# Patient Record
Sex: Female | Born: 1995 | Race: Black or African American | Hispanic: No | Marital: Single | Smoking: Never smoker
Health system: Southern US, Community
[De-identification: ages and names within clinical notes are randomized; demographics above are authoritative.]

## PROBLEM LIST (undated history)

## (undated) ENCOUNTER — Inpatient Hospital Stay (HOSPITAL_COMMUNITY): Payer: Self-pay

---
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
## 2014-02-22 ENCOUNTER — Encounter (HOSPITAL_COMMUNITY): Payer: Self-pay | Admitting: Emergency Medicine

## 2014-02-22 ENCOUNTER — Emergency Department (HOSPITAL_COMMUNITY)
Admission: EM | Admit: 2014-02-22 | Discharge: 2014-02-22 | Disposition: A | Discharge status: Home / Self Care | Payer: 59 | Attending: Emergency Medicine | Admitting: Emergency Medicine

## 2014-02-22 ENCOUNTER — Emergency Department (HOSPITAL_COMMUNITY): Payer: 59

## 2014-02-22 DIAGNOSIS — M542 Cervicalgia: Secondary | ICD-10-CM

## 2014-02-22 DIAGNOSIS — Y92410 Unspecified street and highway as the place of occurrence of the external cause: Secondary | ICD-10-CM | POA: Insufficient documentation

## 2014-02-22 DIAGNOSIS — Y9389 Activity, other specified: Secondary | ICD-10-CM | POA: Insufficient documentation

## 2014-02-22 DIAGNOSIS — R519 Headache, unspecified: Secondary | ICD-10-CM

## 2014-02-22 DIAGNOSIS — R51 Headache: Secondary | ICD-10-CM

## 2014-02-22 DIAGNOSIS — Y998 Other external cause status: Secondary | ICD-10-CM | POA: Insufficient documentation

## 2014-02-22 DIAGNOSIS — S0990XA Unspecified injury of head, initial encounter: Secondary | ICD-10-CM | POA: Insufficient documentation

## 2014-02-22 DIAGNOSIS — S199XXA Unspecified injury of neck, initial encounter: Secondary | ICD-10-CM | POA: Insufficient documentation

## 2014-02-22 MED ORDER — CYCLOBENZAPRINE HCL 10 MG PO TABS: 10.0000 mg | ORAL_TABLET | Freq: Two times a day (BID) | ORAL | Status: DC | PRN

## 2014-02-22 MED ORDER — IBUPROFEN 800 MG PO TABS: 800.0000 mg | ORAL_TABLET | Freq: Three times a day (TID) | ORAL | Status: DC

## 2014-02-22 MED ORDER — KETOROLAC TROMETHAMINE 30 MG/ML IJ SOLN
30.0000 mg | Freq: Once | INTRAMUSCULAR | Status: AC
  Administered 2014-02-22: 30 mg via INTRAMUSCULAR
  Filled 2014-02-22: qty 1
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
## 2014-02-22 NOTE — ED Notes (Signed)
Patient talking on cell phone when entering the room.
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
## 2014-02-24 ENCOUNTER — Emergency Department (HOSPITAL_COMMUNITY)
Admission: EM | Admit: 2014-02-24 | Discharge: 2014-02-24 | Disposition: A | Discharge status: Home / Self Care | Payer: 59 | Attending: Emergency Medicine | Admitting: Emergency Medicine

## 2014-02-24 ENCOUNTER — Encounter (HOSPITAL_COMMUNITY): Payer: Self-pay | Admitting: Emergency Medicine

## 2014-02-24 DIAGNOSIS — R42 Dizziness and giddiness: Secondary | ICD-10-CM | POA: Insufficient documentation

## 2014-02-24 DIAGNOSIS — H539 Unspecified visual disturbance: Secondary | ICD-10-CM | POA: Diagnosis not present

## 2014-02-24 DIAGNOSIS — Z791 Long term (current) use of non-steroidal anti-inflammatories (NSAID): Secondary | ICD-10-CM | POA: Diagnosis not present

## 2014-02-24 DIAGNOSIS — G8911 Acute pain due to trauma: Secondary | ICD-10-CM | POA: Insufficient documentation

## 2014-02-24 DIAGNOSIS — R0981 Nasal congestion: Secondary | ICD-10-CM | POA: Insufficient documentation

## 2014-02-24 DIAGNOSIS — R51 Headache: Secondary | ICD-10-CM | POA: Diagnosis present

## 2014-02-24 DIAGNOSIS — R519 Headache, unspecified: Secondary | ICD-10-CM

## 2014-02-24 MED ORDER — METOCLOPRAMIDE HCL 10 MG PO TABS: 10.0000 mg | ORAL_TABLET | Freq: Four times a day (QID) | ORAL | Status: DC | PRN

## 2014-02-24 MED ORDER — ACETAMINOPHEN 500 MG PO TABS
1000.0000 mg | ORAL_TABLET | Freq: Once | ORAL | Status: AC
  Administered 2014-02-24: 1000 mg via ORAL
  Filled 2014-02-24: qty 2

## 2014-02-24 NOTE — Discharge Instructions (Signed)
Concussion  A concussion, or closed-head injury, is a brain injury caused by a direct blow to the head or by a quick and sudden movement (jolt) of the head or neck. Concussions are usually not life-threatening. Even so, the effects of a concussion can be serious. If you have had a concussion before, you are more likely to experience concussion-like symptoms after a direct blow to the head.   CAUSES  · Direct blow to the head, such as from running into another player during a soccer game, being hit in a fight, or hitting your head on a hard surface.  · A jolt of the head or neck that causes the brain to move back and forth inside the skull, such as in a car crash.  SIGNS AND SYMPTOMS  The signs of a concussion can be hard to notice. Early on, they may be missed by you, family members, and health care providers. You may look fine but act or feel differently.  Symptoms are usually temporary, but they may last for days, weeks, or even longer. Some symptoms may appear right away while others may not show up for hours or days. Every head injury is different. Symptoms include:  · Mild to moderate headaches that will not go away.  · A feeling of pressure inside your head.  · Having more trouble than usual:  ¨ Learning or remembering things you have heard.  ¨ Answering questions.  ¨ Paying attention or concentrating.  ¨ Organizing daily tasks.  ¨ Making decisions and solving problems.  · Slowness in thinking, acting or reacting, speaking, or reading.  · Getting lost or being easily confused.  · Feeling tired all the time or lacking energy (fatigued).  · Feeling drowsy.  · Sleep disturbances.  ¨ Sleeping more than usual.  ¨ Sleeping less than usual.  ¨ Trouble falling asleep.  ¨ Trouble sleeping (insomnia).  · Loss of balance or feeling lightheaded or dizzy.  · Nausea or vomiting.  · Numbness or tingling.  · Increased sensitivity to:  ¨ Sounds.  ¨ Lights.  ¨ Distractions.  · Vision problems or eyes that tire  easily.  · Diminished sense of taste or smell.  · Ringing in the ears.  · Mood changes such as feeling sad or anxious.  · Becoming easily irritated or angry for little or no reason.  · Lack of motivation.  · Seeing or hearing things other people do not see or hear (hallucinations).  DIAGNOSIS  Your health care provider can usually diagnose a concussion based on a description of your injury and symptoms. He or she will ask whether you passed out (lost consciousness) and whether you are having trouble remembering events that happened right before and during your injury.  Your evaluation might include:  · A brain scan to look for signs of injury to the brain. Even if the test shows no injury, you may still have a concussion.  · Blood tests to be sure other problems are not present.  TREATMENT  · Concussions are usually treated in an emergency department, in urgent care, or at a clinic. You may need to stay in the hospital overnight for further treatment.  · Tell your health care provider if you are taking any medicines, including prescription medicines, over-the-counter medicines, and natural remedies. Some medicines, such as blood thinners (anticoagulants) and aspirin, may increase the chance of complications. Also tell your health care provider whether you have had alcohol or are taking illegal drugs. This information   may affect treatment.  · Your health care provider will send you home with important instructions to follow.  · How fast you will recover from a concussion depends on many factors. These factors include how severe your concussion is, what part of your brain was injured, your age, and how healthy you were before the concussion.  · Most people with mild injuries recover fully. Recovery can take time. In general, recovery is slower in older persons. Also, persons who have had a concussion in the past or have other medical problems may find that it takes longer to recover from their current injury.  HOME  CARE INSTRUCTIONS  General Instructions  · Carefully follow the directions your health care provider gave you.  · Only take over-the-counter or prescription medicines for pain, discomfort, or fever as directed by your health care provider.  · Take only those medicines that your health care provider has approved.  · Do not drink alcohol until your health care provider says you are well enough to do so. Alcohol and certain other drugs may slow your recovery and can put you at risk of further injury.  · If it is harder than usual to remember things, write them down.  · If you are easily distracted, try to do one thing at a time. For example, do not try to watch TV while fixing dinner.  · Talk with family members or close friends when making important decisions.  · Keep all follow-up appointments. Repeated evaluation of your symptoms is recommended for your recovery.  · Watch your symptoms and tell others to do the same. Complications sometimes occur after a concussion. Older adults with a brain injury may have a higher risk of serious complications, such as a blood clot on the brain.  · Tell your teachers, school nurse, school counselor, coach, athletic trainer, or work manager about your injury, symptoms, and restrictions. Tell them about what you can or cannot do. They should watch for:  ¨ Increased problems with attention or concentration.  ¨ Increased difficulty remembering or learning new information.  ¨ Increased time needed to complete tasks or assignments.  ¨ Increased irritability or decreased ability to cope with stress.  ¨ Increased symptoms.  · Rest. Rest helps the brain to heal. Make sure you:  ¨ Get plenty of sleep at night. Avoid staying up late at night.  ¨ Keep the same bedtime hours on weekends and weekdays.  ¨ Rest during the day. Take daytime naps or rest breaks when you feel tired.  · Limit activities that require a lot of thought or concentration. These include:  ¨ Doing homework or job-related  work.  ¨ Watching TV.  ¨ Working on the computer.  · Avoid any situation where there is potential for another head injury (football, hockey, soccer, basketball, martial arts, downhill snow sports and horseback riding). Your condition will get worse every time you experience a concussion. You should avoid these activities until you are evaluated by the appropriate follow-up health care providers.  Returning To Your Regular Activities  You will need to return to your normal activities slowly, not all at once. You must give your body and brain enough time for recovery.  · Do not return to sports or other athletic activities until your health care provider tells you it is safe to do so.  · Ask your health care provider when you can drive, ride a bicycle, or operate heavy machinery. Your ability to react may be slower after a   brain injury. Never do these activities if you are dizzy.  · Ask your health care provider about when you can return to work or school.  Preventing Another Concussion  It is very important to avoid another brain injury, especially before you have recovered. In rare cases, another injury can lead to permanent brain damage, brain swelling, or death. The risk of this is greatest during the first 7-10 days after a head injury. Avoid injuries by:  · Wearing a seat belt when riding in a car.  · Drinking alcohol only in moderation.  · Wearing a helmet when biking, skiing, skateboarding, skating, or doing similar activities.  · Avoiding activities that could lead to a second concussion, such as contact or recreational sports, until your health care provider says it is okay.  · Taking safety measures in your home.  ¨ Remove clutter and tripping hazards from floors and stairways.  ¨ Use grab bars in bathrooms and handrails by stairs.  ¨ Place non-slip mats on floors and in bathtubs.  ¨ Improve lighting in dim areas.  SEEK MEDICAL CARE IF:  · You have increased problems paying attention or  concentrating.  · You have increased difficulty remembering or learning new information.  · You need more time to complete tasks or assignments than before.  · You have increased irritability or decreased ability to cope with stress.  · You have more symptoms than before.  Seek medical care if you have any of the following symptoms for more than 2 weeks after your injury:  · Lasting (chronic) headaches.  · Dizziness or balance problems.  · Nausea.  · Vision problems.  · Increased sensitivity to noise or light.  · Depression or mood swings.  · Anxiety or irritability.  · Memory problems.  · Difficulty concentrating or paying attention.  · Sleep problems.  · Feeling tired all the time.  SEEK IMMEDIATE MEDICAL CARE IF:  · You have severe or worsening headaches. These may be a sign of a blood clot in the brain.  · You have weakness (even if only in one hand, leg, or part of the face).  · You have numbness.  · You have decreased coordination.  · You vomit repeatedly.  · You have increased sleepiness.  · One pupil is larger than the other.  · You have convulsions.  · You have slurred speech.  · You have increased confusion. This may be a sign of a blood clot in the brain.  · You have increased restlessness, agitation, or irritability.  · You are unable to recognize people or places.  · You have neck pain.  · It is difficult to wake you up.  · You have unusual behavior changes.  · You lose consciousness.  MAKE SURE YOU:  · Understand these instructions.  · Will watch your condition.  · Will get help right away if you are not doing well or get worse.  Document Released: 06/23/2003 Document Revised: 04/07/2013 Document Reviewed: 10/23/2012  ExitCare® Patient Information ©2015 [employer]. This information is not intended to replace advice given to you by your health care provider. Make sure you discuss any questions you have with your health care provider.
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
## 2015-06-03 ENCOUNTER — Encounter (HOSPITAL_COMMUNITY): Payer: Self-pay | Admitting: Emergency Medicine

## 2015-06-03 ENCOUNTER — Emergency Department (HOSPITAL_COMMUNITY)
Admission: EM | Admit: 2015-06-03 | Discharge: 2015-06-03 | Disposition: A | Discharge status: Home / Self Care | Payer: Self-pay | Source: Home / Self Care

## 2015-06-03 ENCOUNTER — Emergency Department (INDEPENDENT_AMBULATORY_CARE_PROVIDER_SITE_OTHER)
Admission: EM | Admit: 2015-06-03 | Discharge: 2015-06-03 | Disposition: A | Discharge status: Home / Self Care | Payer: Self-pay | Source: Home / Self Care | Attending: Family Medicine | Admitting: Family Medicine

## 2015-06-03 DIAGNOSIS — L254 Unspecified contact dermatitis due to food in contact with skin: Secondary | ICD-10-CM

## 2015-06-03 NOTE — Discharge Instructions (Signed)
Contact Dermatitis Continue using the hydrocortisone cream as needed 2-3 times a day. May also take antihistamine such as Benadryl or nonsedating antihistamine such as Claritin or Allegra. 4 development of swelling may use cold compresses. Dermatitis is redness, soreness, and swelling (inflammation) of the skin. Contact dermatitis is a reaction to certain substances that touch the skin. There are two types of contact dermatitis:   Irritant contact dermatitis. This type is caused by something that irritates your skin, such as dry hands from washing them too much. This type does not require previous exposure to the substance for a reaction to occur. This type is more common.  Allergic contact dermatitis. This type is caused by a substance that you are allergic to, such as a nickel allergy or poison ivy. This type only occurs if you have been exposed to the substance (allergen) before. Upon a repeat exposure, your body reacts to the substance. This type is less common. CAUSES  Many different substances can cause contact dermatitis. Irritant contact dermatitis is most commonly caused by exposure to:   Makeup.   Soaps.   Detergents.   Bleaches.   Acids.   Metal salts, such as nickel.  Allergic contact dermatitis is most commonly caused by exposure to:   Poisonous plants.   Chemicals.   Jewelry.   Latex.   Medicines.   Preservatives in products, such as clothing.  RISK FACTORS This condition is more likely to develop in:   People who have jobs that expose them to irritants or allergens.  People who have certain medical conditions, such as asthma or eczema.  SYMPTOMS  Symptoms of this condition may occur anywhere on your body where the irritant has touched you or is touched by you. Symptoms include:  Dryness or flaking.   Redness.   Cracks.   Itching.   Pain or a burning feeling.   Blisters.  Drainage of small amounts of blood or clear fluid from skin  cracks. With allergic contact dermatitis, there may also be swelling in areas such as the eyelids, mouth, or genitals.  DIAGNOSIS  This condition is diagnosed with a medical history and physical exam. A patch skin test may be performed to help determine the cause. If the condition is related to your job, you may need to see an occupational medicine specialist. TREATMENT Treatment for this condition includes figuring out what caused the reaction and protecting your skin from further contact. Treatment may also include:   Steroid creams or ointments. Oral steroid medicines may be needed in more severe cases.  Antibiotics or antibacterial ointments, if a skin infection is present.  Antihistamine lotion or an antihistamine taken by mouth to ease itching.  A bandage (dressing). HOME CARE INSTRUCTIONS Skin Care  Moisturize your skin as needed.   Apply cool compresses to the affected areas.  Try taking a bath with:  Epsom salts. Follow the instructions on the packaging. You can get these at your local pharmacy or grocery store.  Baking soda. Pour a small amount into the bath as directed by your health care provider.  Colloidal oatmeal. Follow the instructions on the packaging. You can get this at your local pharmacy or grocery store.  Try applying baking soda paste to your skin. Stir water into baking soda until it reaches a paste-like consistency.  Do not scratch your skin.  Bathe less frequently, such as every other day.  Bathe in lukewarm water. Avoid using hot water. Medicines  Take or apply over-the-counter and prescription medicines  only as told by your health care provider.   If you were prescribed an antibiotic medicine, take or apply your antibiotic as told by your health care provider. Do not stop using the antibiotic even if your condition starts to improve. General Instructions  Keep all follow-up visits as told by your health care provider. This is  important.  Avoid the substance that caused your reaction. If you do not know what caused it, keep a journal to try to track what caused it. Write down:  What you eat.  What cosmetic products you use.  What you drink.  What you wear in the affected area. This includes jewelry.  If you were given a dressing, take care of it as told by your health care provider. This includes when to change and remove it. SEEK MEDICAL CARE IF:   Your condition does not improve with treatment.  Your condition gets worse.  You have signs of infection such as swelling, tenderness, redness, soreness, or warmth in the affected area.  You have a fever.  You have new symptoms. SEEK IMMEDIATE MEDICAL CARE IF:   You have a severe headache, neck pain, or neck stiffness.  You vomit.  You feel very sleepy.  You notice red streaks coming from the affected area.  Your bone or joint underneath the affected area becomes painful after the skin has healed.  The affected area turns darker.  You have difficulty breathing.   This information is not intended to replace advice given to you by your health care provider. Make sure you discuss any questions you have with your health care provider.   Document Released: 03/30/2000 Document Revised: 12/22/2014 Document Reviewed: 08/18/2014 Elsevier Interactive Patient Education [employer]

## 2015-06-03 NOTE — ED Provider Notes (Signed)
CSN: [id number]     Arrival date & time 06/03/15  1610 History   First MD Initiated Contact with Patient 06/03/15 1717     Chief Complaint  Patient presents with  . Rash  . Facial Swelling   (Consider location/radiation/quality/duration/timing/severity/associated sxs/prior Treatment) HPI Comments: 20 year old female was at work this afternoon and she was handling fish and some of the "fish juice" splashed on her face. This occurred approximately 1:59 PM today. This caused some irritation of the skin with minor swelling and itching. She went to the bathroom and washed her face and then applied cortisone cream. But now there are no lesions, swelling or itching. She states she feels well and back to normal. She did not have a cough, intraoral swelling, trouble breathing or systemic symptoms. No other areas of itching or rash.  Patient is a 20 y.o. female presenting with rash.  Rash   History reviewed. No pertinent past medical history. History reviewed. No pertinent past surgical history. Family History  Problem Relation Age of Onset  . Hypertension Mother   . Hyperlipidemia Mother   . Hypertension Father   . Hyperlipidemia Other   . Heart failure Other   . Diabetes Other   . Hypertension Other    Social History  Substance Use Topics  . Smoking status: Never Smoker   . Smokeless tobacco: None  . Alcohol Use: No   OB History    No data available     Review of Systems  Constitutional: Negative.   HENT: Negative.   Respiratory: Negative.   Cardiovascular: Negative.   Genitourinary: Negative.   Musculoskeletal: Negative.   Skin: Positive for rash.  Neurological: Negative.     Allergies  Review of patient's allergies indicates no known allergies.  Home Medications   Prior to Admission medications   Medication Sig Start Date End Date Taking? Authorizing Provider  cyclobenzaprine (FLEXERIL) 10 MG tablet Take 1 tablet (10 mg total) by mouth 2 (two) times daily as needed  for muscle spasms. 02/22/14   Courtney Forcucci, PA-C  ibuprofen (ADVIL,MOTRIN) 800 MG tablet Take 1 tablet (800 mg total) by mouth 3 (three) times daily. 02/22/14   Courtney Forcucci, PA-C  metoCLOPramide (REGLAN) 10 MG tablet Take 1 tablet (10 mg total) by mouth every 6 (six) hours as needed for nausea (nausea/headache). 02/24/14   Ladona Mow, PA-C   Meds Ordered and Administered this Visit  Medications - No data to display  BP 129/85 mmHg  Pulse 90  Temp(Src) 98.5 F (36.9 C) (Oral)  Resp 14  SpO2 100% No data found.   Physical Exam  Constitutional: She is oriented to person, place, and time. She appears well-developed and well-nourished. No distress.  HENT:  Right Ear: External ear normal.  Left Ear: External ear normal.  Oropharynx with out swelling or erythema. There is mild-to-moderate clear PND. No intraoral edema, erythema or lesions. Swallowing reflex is normal.  Eyes: Conjunctivae and EOM are normal.  Neck: Normal range of motion. Neck supple.  Cardiovascular: Normal rate, regular rhythm and normal heart sounds.   Pulmonary/Chest: Effort normal and breath sounds normal.  Musculoskeletal: Normal range of motion. She exhibits no edema.  Lymphadenopathy:    She has no cervical adenopathy.  Neurological: She is alert and oriented to person, place, and time.  Skin: Skin is warm and dry. No rash noted. No erythema.  Psychiatric: She has a normal mood and affect.  Nursing note and vitals reviewed.   ED Course  Procedures (including critical  care time)  Labs Review Labs Reviewed - No data to display  Imaging Review No results found.   Visual Acuity Review  Right Eye Distance:   Left Eye Distance:   Bilateral Distance:    Right Eye Near:   Left Eye Near:    Bilateral Near:         MDM   1. Contact dermatitis due to fish    There are no symptoms or signs of an allergic reaction at this time. No lesions. No swelling. She states she feels well. Continue  using the hydrocortisone cream as needed 2-3 times a day. May also take antihistamine such as Benadryl or nonsedating antihistamine such as Claritin or Allegra. 4 development of swelling may use cold compresses.     Hayden Rasmussen, NP 06/03/15 [phone]

## 2015-06-03 NOTE — ED Notes (Signed)
The patient presented to the [facility] with a complaint of a possible contact reaction to fish that occurred today. The patient stated that some of the fish splattered in her face and her face started to itch and swell. The patient stated that she used cortizone cream on her face and it seemed to help the symptoms.

## 2015-08-24 ENCOUNTER — Other Ambulatory Visit: Payer: Self-pay | Admitting: Obstetrics & Gynecology

## 2015-08-24 DIAGNOSIS — E010 Iodine-deficiency related diffuse (endemic) goiter: Secondary | ICD-10-CM

## 2015-08-26 ENCOUNTER — Other Ambulatory Visit: Payer: BLUE CROSS/BLUE SHIELD

## 2016-01-12 ENCOUNTER — Ambulatory Visit (INDEPENDENT_AMBULATORY_CARE_PROVIDER_SITE_OTHER): Payer: BLUE CROSS/BLUE SHIELD | Admitting: *Deleted

## 2016-01-12 DIAGNOSIS — Z3202 Encounter for pregnancy test, result negative: Secondary | ICD-10-CM

## 2016-01-12 DIAGNOSIS — Z3200 Encounter for pregnancy test, result unknown: Secondary | ICD-10-CM

## 2016-01-12 LAB — POCT PREGNANCY, URINE: Preg Test, Ur: NEGATIVE

## 2016-01-12 NOTE — Progress Notes (Signed)
Patient presenting for pregnancy test which is negative. Patient informed of results, understanding voiced.

## 2016-03-04 ENCOUNTER — Emergency Department (HOSPITAL_COMMUNITY)
Admission: EM | Admit: 2016-03-04 | Discharge: 2016-03-04 | Disposition: A | Discharge status: Home / Self Care | Payer: BLUE CROSS/BLUE SHIELD | Attending: Emergency Medicine | Admitting: Emergency Medicine

## 2016-03-04 ENCOUNTER — Emergency Department (HOSPITAL_COMMUNITY): Payer: BLUE CROSS/BLUE SHIELD

## 2016-03-04 ENCOUNTER — Encounter (HOSPITAL_COMMUNITY): Payer: Self-pay | Admitting: Emergency Medicine

## 2016-03-04 DIAGNOSIS — R519 Headache, unspecified: Secondary | ICD-10-CM

## 2016-03-04 DIAGNOSIS — R51 Headache: Secondary | ICD-10-CM | POA: Insufficient documentation

## 2016-03-04 LAB — POC URINE PREG, ED: Preg Test, Ur: NEGATIVE

## 2016-03-04 MED ORDER — PROMETHAZINE HCL 25 MG/ML IJ SOLN
25.0000 mg | Freq: Once | INTRAMUSCULAR | Status: AC
  Administered 2016-03-04: 25 mg via INTRAMUSCULAR
  Filled 2016-03-04: qty 1

## 2016-03-04 MED ORDER — KETOROLAC TROMETHAMINE 60 MG/2ML IM SOLN
60.0000 mg | Freq: Once | INTRAMUSCULAR | Status: AC
  Administered 2016-03-04: 60 mg via INTRAMUSCULAR
  Filled 2016-03-04: qty 2

## 2016-03-04 NOTE — ED Triage Notes (Signed)
C/o headache x 1 week.  Denies nausea or visual problems.

## 2016-03-04 NOTE — Discharge Instructions (Signed)
Tylenol 1000 mg rotated with Motrin 600 mg every 4 hours as needed for pain.  Return to the emergency department if your symptoms significantly worsen or change.

## 2016-03-04 NOTE — ED Provider Notes (Signed)
[facility]-EMERGENCY DEPT Provider Note   CSN: [id number] Arrival date & time: 03/04/16  1741     History   Chief Complaint Chief Complaint  Patient presents with  . Headache    HPI Ashley Pruitt is a 20 y.o. female.  Patient is a 20 year old female who presents with complaints of headache. The headache is frontal in nature and has been persistent for the past week. She has had no relief with over-the-counter medications. She denies any injury or trauma. She denies any fevers, chills, or stiff neck. She denies a history of migraines and is never seen a doctor before for a headache. She denies any excessive alcohol or caffeine consumption.   The history is provided by the patient.  Headache   This is a new problem. Episode onset: 1 week ago. The problem occurs constantly. The problem has been gradually worsening. The headache is associated with bright light. The pain is located in the frontal region. The quality of the pain is described as dull. The pain is moderate. Pertinent negatives include no fever, no nausea and no vomiting. She has tried NSAIDs for the symptoms. The treatment provided no relief.    History reviewed. No pertinent past medical history.  There are no active problems to display for this patient.   History reviewed. No pertinent surgical history.  OB History    No data available       Home Medications    Prior to Admission medications   Medication Sig Start Date End Date Taking? Authorizing Provider  cyclobenzaprine (FLEXERIL) 10 MG tablet Take 1 tablet (10 mg total) by mouth 2 (two) times daily as needed for muscle spasms. 02/22/14   Courtney Forcucci, PA-C  ibuprofen (ADVIL,MOTRIN) 800 MG tablet Take 1 tablet (800 mg total) by mouth 3 (three) times daily. 02/22/14   Courtney Forcucci, PA-C  metoCLOPramide (REGLAN) 10 MG tablet Take 1 tablet (10 mg total) by mouth every 6 (six) hours as needed for nausea (nausea/headache). 02/24/14   Ladona MowJoe Mintz, PA-C     Family History Family History  Problem Relation Age of Onset  . Hypertension Mother   . Hyperlipidemia Mother   . Hypertension Father   . Hyperlipidemia Other   . Heart failure Other   . Diabetes Other   . Hypertension Other     Social History Social History  Substance Use Topics  . Smoking status: Never Smoker  . Smokeless tobacco: Never Used  . Alcohol use No     Allergies   Patient has no known allergies.   Review of Systems Review of Systems  Constitutional: Negative for fever.  Gastrointestinal: Negative for nausea and vomiting.  Neurological: Positive for headaches.  All other systems reviewed and are negative.    Physical Exam Updated Vital Signs BP 130/82 (BP Location: Left Arm)   Pulse 97   Temp 98.1 F (36.7 C) (Oral)   Resp 12   Ht 5\' 3"  (1.6 m)   Wt 197 lb (89.4 kg)   LMP 02/19/2016   SpO2 100%   BMI 34.90 kg/m   Physical Exam  Constitutional: She is oriented to person, place, and time. She appears well-developed and well-nourished. No distress.  HENT:  Head: Normocephalic and atraumatic.  Eyes: EOM are normal. Pupils are equal, round, and reactive to light.  There is no papilledema on funduscopic exam  Neck: Normal range of motion. Neck supple.  Cardiovascular: Normal rate and regular rhythm.  Exam reveals no gallop and no friction rub.  No murmur heard. Pulmonary/Chest: Effort normal and breath sounds normal. No respiratory distress. She has no wheezes.  Abdominal: Soft. Bowel sounds are normal. She exhibits no distension. There is no tenderness.  Musculoskeletal: Normal range of motion.  Neurological: She is alert and oriented to person, place, and time. No cranial nerve deficit. She exhibits normal muscle tone. Coordination normal.  Skin: Skin is warm and dry. She is not diaphoretic.  Nursing note and vitals reviewed.    ED Treatments / Results  Labs (all labs ordered are listed, but only abnormal results are displayed) Labs  Reviewed - No data to display  EKG  EKG Interpretation None       Radiology No results found.  Procedures Procedures (including critical care time)  Medications Ordered in ED Medications  ketorolac (TORADOL) injection 60 mg (not administered)  promethazine (PHENERGAN) injection 25 mg (not administered)     Initial Impression / Assessment and Plan / ED Course  I have reviewed the triage vital signs and the nursing notes.  Pertinent labs & imaging results that were available during my care of the patient were reviewed by me and considered in my medical decision making (see chart for details).  Clinical Course     Patient presents with persistent headache for the past week that began in the absence of any injury or trauma. Her neurologic exam is unremarkable and head CT is negative. I am uncertain as to the exact etiology of her headache, however she is feeling better after Toradol and Phenergan and nothing appears emergent. She will be discharged with alternating Tylenol and Motrin and follow-up/return as needed.  Final Clinical Impressions(s) / ED Diagnoses   Final diagnoses:  None    New Prescriptions New Prescriptions   No medications on file     Geoffery Lyonsouglas Nivea Wojdyla, MD 03/04/16 2013

## 2016-07-19 ENCOUNTER — Inpatient Hospital Stay (HOSPITAL_COMMUNITY): Payer: BLUE CROSS/BLUE SHIELD

## 2016-07-19 ENCOUNTER — Encounter (HOSPITAL_COMMUNITY): Payer: Self-pay | Admitting: *Deleted

## 2016-07-19 ENCOUNTER — Inpatient Hospital Stay (HOSPITAL_COMMUNITY)
Admission: AD | Admit: 2016-07-19 | Discharge: 2016-07-19 | Disposition: A | Discharge status: Home / Self Care | Payer: BLUE CROSS/BLUE SHIELD | Source: Ambulatory Visit | Attending: Obstetrics & Gynecology | Admitting: Obstetrics & Gynecology

## 2016-07-19 DIAGNOSIS — R109 Unspecified abdominal pain: Secondary | ICD-10-CM | POA: Insufficient documentation

## 2016-07-19 DIAGNOSIS — M545 Low back pain: Secondary | ICD-10-CM | POA: Insufficient documentation

## 2016-07-19 DIAGNOSIS — Z3491 Encounter for supervision of normal pregnancy, unspecified, first trimester: Secondary | ICD-10-CM

## 2016-07-19 DIAGNOSIS — O9989 Other specified diseases and conditions complicating pregnancy, childbirth and the puerperium: Secondary | ICD-10-CM

## 2016-07-19 DIAGNOSIS — Z3A01 Less than 8 weeks gestation of pregnancy: Secondary | ICD-10-CM | POA: Diagnosis not present

## 2016-07-19 DIAGNOSIS — O26891 Other specified pregnancy related conditions, first trimester: Secondary | ICD-10-CM

## 2016-07-19 LAB — CBC
HCT: 37.2 % (ref 36.0–46.0)
Hemoglobin: 12.4 g/dL (ref 12.0–15.0)
MCH: 26.2 pg (ref 26.0–34.0)
MCHC: 33.3 g/dL (ref 30.0–36.0)
MCV: 78.6 fL (ref 78.0–100.0)
PLATELETS: 305 10*3/uL (ref 150–400)
RBC: 4.73 MIL/uL (ref 3.87–5.11)
RDW: 15.9 % — ABNORMAL HIGH (ref 11.5–15.5)
WBC: 7.5 10*3/uL (ref 4.0–10.5)

## 2016-07-19 LAB — WET PREP, GENITAL
SPERM: NONE SEEN
TRICH WET PREP: NONE SEEN
Yeast Wet Prep HPF POC: NONE SEEN

## 2016-07-19 LAB — URINALYSIS, ROUTINE W REFLEX MICROSCOPIC
Bilirubin Urine: NEGATIVE
Glucose, UA: NEGATIVE mg/dL
HGB URINE DIPSTICK: NEGATIVE
KETONES UR: NEGATIVE mg/dL
Leukocytes, UA: NEGATIVE
NITRITE: NEGATIVE
Protein, ur: NEGATIVE mg/dL
Specific Gravity, Urine: 1.008 (ref 1.005–1.030)
pH: 6 (ref 5.0–8.0)

## 2016-07-19 LAB — ABO/RH: ABO/RH(D): A POS

## 2016-07-19 LAB — POCT PREGNANCY, URINE: PREG TEST UR: POSITIVE — AB

## 2016-07-19 LAB — HCG, QUANTITATIVE, PREGNANCY: hCG, Beta Chain, Quant, S: 69905 m[IU]/mL — ABNORMAL HIGH (ref <5)

## 2016-07-19 NOTE — Discharge Instructions (Signed)
[facility]  ° ° °[facility]       Phone: [phone] ° °[facility] Phone: [phone] ° °[facility]  Phone: [phone] ° °[facility] [place]  Phone: [phone] ° °[facility] [place]  Phone: [phone] ° °[facility]       Phone: [phone] ° °[facility]    Phone: [phone]  ° °[facility]    Phone: [phone] ° °[facility]    Phone: [phone] ° °[facility]    Phone: [phone] ° °[facility]    Phone: [phone] ° °[facility]       Phone: [phone]  ° °[facility]  Phone: [phone] ° °[facility]    Phone: [phone] ° °[facility]   Phone: [phone] ° °[facility]      Phone: [phone] ° °[facility]    Phone: [phone] ° °

## 2016-07-19 NOTE — MAU Note (Signed)
Pt C/O lower abd cramping & pressure, also back pain for the past week.  Denies bleeding.  Also vomiting for the past week, has constipation.

## 2016-07-19 NOTE — MAU Provider Note (Signed)
Chief Complaint: Abdominal Pain and Back Pain   None     SUBJECTIVE HPI: Ashley Pruitt is a 21 y.o. G1P0 at 8 weeks by LMP who presents to maternity admissions reporting Lower abdominal and Lower back pain. She has not taken a pregnancy test and was not aware of pregnancy until MAU visit. She states that the pain is dull, cramping in nature, non-radiating, and sporadic.  She has taken Tylenol at home without relief, and nothing that she notices makes the pain worse.  She endorses having unprotected sexual intercourse with a female partner, nausea for 3 days, and [facility] ending approximately on Feb. 11.  She denies vaginal bleeding, vaginal itching/burning, urinary symptoms, h/a, dizziness, vomiting, diarrhea, or fever/chills.     HPI  History reviewed. No pertinent past medical history. History reviewed. No pertinent surgical history. Social History   Social History  . Marital status: Single    Spouse name: N/A  . Number of children: N/A  . Years of education: N/A   Occupational History  . Not on file.   Social History Main Topics  . Smoking status: Never Smoker  . Smokeless tobacco: Never Used  . Alcohol use No     Comment: socially  . Drug use: No  . Sexual activity: Yes    Birth control/ protection: Condom   Other Topics Concern  . Not on file   Social History Narrative  . No narrative on file   No current facility-administered medications on file prior to encounter.    No current outpatient prescriptions on file prior to encounter.   No Known Allergies  ROS:  Review of Systems  Constitutional: Negative.   HENT: Negative.   Respiratory: Negative.   Cardiovascular: Negative.   Gastrointestinal: Positive for abdominal pain and nausea. Negative for abdominal distention, blood in stool, constipation, diarrhea and vomiting.  Genitourinary: Negative for difficulty urinating, dyspareunia, dysuria, flank pain, frequency, hematuria, vaginal bleeding and vaginal discharge.   Musculoskeletal: Positive for back pain. Negative for arthralgias, gait problem and myalgias.  Neurological: Negative for dizziness, weakness, numbness and headaches.     I have reviewed patient's Past Medical Hx, Surgical Hx, Family Hx, Social Hx, medications and allergies.   Physical Exam  Patient Vitals for the past 24 hrs:  BP Temp Temp src Pulse Resp  07/19/16 1919 113/66 99.1 F (37.3 C) Oral 86 17  07/19/16 1546 122/72 98.6 F (37 C) Oral 96 18     LAB RESULTS Results for orders placed or performed during the hospital encounter of 07/19/16 (from the past 24 hour(s))  Urinalysis, Routine w reflex microscopic     Status: Abnormal   Collection Time: 07/19/16  3:50 PM  Result Value Ref Range   Color, Urine YELLOW YELLOW   APPearance HAZY (A) CLEAR   Specific Gravity, Urine 1.008 1.005 - 1.030   pH 6.0 5.0 - 8.0   Glucose, UA NEGATIVE NEGATIVE mg/dL   Hgb urine dipstick NEGATIVE NEGATIVE   Bilirubin Urine NEGATIVE NEGATIVE   Ketones, ur NEGATIVE NEGATIVE mg/dL   Protein, ur NEGATIVE NEGATIVE mg/dL   Nitrite NEGATIVE NEGATIVE   Leukocytes, UA NEGATIVE NEGATIVE  Pregnancy, urine POC     Status: Abnormal   Collection Time: 07/19/16  4:13 PM  Result Value Ref Range   Preg Test, Ur POSITIVE (A) NEGATIVE  CBC     Status: Abnormal   Collection Time: 07/19/16  5:29 PM  Result Value Ref Range   WBC 7.5 4.0 - 10.5 K/uL  RBC 4.73 3.87 - 5.11 MIL/uL   Hemoglobin 12.4 12.0 - 15.0 g/dL   HCT 16.1 09.6 - 04.5 %   MCV 78.6 78.0 - 100.0 fL   MCH 26.2 26.0 - 34.0 pg   MCHC 33.3 30.0 - 36.0 g/dL   RDW 40.9 (H) 81.1 - 91.4 %   Platelets 305 150 - 400 K/uL  hCG, quantitative, pregnancy     Status: Abnormal   Collection Time: 07/19/16  5:29 PM  Result Value Ref Range   hCG, Beta Chain, Quant, S 69,905 (H) <5 mIU/mL  ABO/Rh     Status: None (Preliminary result)   Collection Time: 07/19/16  5:29 PM  Result Value Ref Range   ABO/RH(D) A POS     --/--/A POS (04/05  1729)  IMAGING US Ob Comp Less 14 Wks  Result Date: 07/19/2016 CLINICAL DATA:  Abdominal pain during first trimester pregnancy. Gestational age by LMP of 8 weeks 2 days. EXAM: OBSTETRIC <14 WK [place] AND TRANSVAGINAL OB US TECHNIQUE: Both transabdominal and transvaginal ultrasound examinations were performed for complete evaluation of the gestation as well as the maternal uterus, adnexal regions, and pelvic cul-de-sac. Transvaginal technique was performed to assess early pregnancy. COMPARISON:  None. FINDINGS: Intrauterine gestational sac: Single Yolk sac:  Visualized. Embryo:  Visualized. Cardiac Activity: Visualized. Heart Rate: 120  bpm CRL:  6  mm   6 w   3 d                  [place] EDC: 03/11/2017 Subchorionic hemorrhage:  None visualized. Maternal uterus/adnexae: Small left ovarian corpus luteum cyst noted. Normal appearance of right ovary. No mass or free fluid identified. IMPRESSION: Single living IUP measuring 6 weeks 3 days, with [place] EDC of 03/11/2017. No significant maternal uterine or adnexal abnormality identified. Electronically Signed   By: Myles Rosenthal M.D.   On: 07/19/2016 19:25   US Ob Transvaginal  Result Date: 07/19/2016 CLINICAL DATA:  Abdominal pain during first trimester pregnancy. Gestational age by LMP of 8 weeks 2 days. EXAM: OBSTETRIC <14 WK [place] AND TRANSVAGINAL OB US TECHNIQUE: Both transabdominal and transvaginal ultrasound examinations were performed for complete evaluation of the gestation as well as the maternal uterus, adnexal regions, and pelvic cul-de-sac. Transvaginal technique was performed to assess early pregnancy. COMPARISON:  None. FINDINGS: Intrauterine gestational sac: Single Yolk sac:  Visualized. Embryo:  Visualized. Cardiac Activity: Visualized. Heart Rate: 120  bpm CRL:  6  mm   6 w   3 d                  [place] EDC: 03/11/2017 Subchorionic hemorrhage:  None visualized. Maternal uterus/adnexae: Small left ovarian corpus luteum cyst noted. Normal appearance of right ovary. No  mass or free fluid identified. IMPRESSION: Single living IUP measuring 6 weeks 3 days, with [place] EDC of 03/11/2017. No significant maternal uterine or adnexal abnormality identified. Electronically Signed   By: Myles Rosenthal M.D.   On: 07/19/2016 19:25    MAU Management/MDM: Ordered labs and [place] and reviewed results.  IUP at [redacted]w[redacted]d noted on [place] today.  CL cyst on left may be causing pain.  Reviewed normal findings with pt, denies need for treatment.  F/U with prenatal care as desired, list of providers given.  Pt stable at time of discharge.  ASSESSMENT 1. Normal IUP (intrauterine pregnancy) on prenatal ultrasound, first trimester   2. Abdominal pain during pregnancy, first trimester     PLAN Discharge home  Allergies as  of 07/19/2016   No Known Allergies     Medication List    TAKE these medications   acetaminophen 325 MG tablet Commonly known as:  TYLENOL Take 650 mg by mouth every 6 (six) hours as needed for mild pain or headache.   prenatal multivitamin Tabs tablet Take 1 tablet by mouth daily at 12 noon.      Follow-up Information    With prenatal provider of your choice Follow up.   Why:  Return to MAU as needed for OB/Gyn emergencies          Sharen Counter Certified Nurse-Midwife 07/19/2016  7:51 PM

## 2016-07-19 NOTE — MAU Note (Signed)
Pt presents with c/o lower abdominal "period" cramps & pain in lower back.  Denies spotting/vaginal bleeding.

## 2016-07-19 NOTE — MAU Note (Signed)
Discharge instructions reviewed with patient, patient verbalized understanding, e-signature obtained, hard copy given.

## 2016-07-20 LAB — HIV ANTIBODY (ROUTINE TESTING W REFLEX): HIV Screen 4th Generation wRfx: NONREACTIVE

## 2016-07-20 LAB — GC/CHLAMYDIA PROBE AMP (~~LOC~~) NOT AT ARMC
Chlamydia: NEGATIVE
Neisseria Gonorrhea: NEGATIVE

## 2016-08-01 ENCOUNTER — Inpatient Hospital Stay (HOSPITAL_COMMUNITY)
Admission: AD | Admit: 2016-08-01 | Discharge: 2016-08-01 | Disposition: A | Discharge status: Home / Self Care | Payer: BLUE CROSS/BLUE SHIELD | Source: Ambulatory Visit | Attending: Obstetrics and Gynecology | Admitting: Obstetrics and Gynecology

## 2016-08-01 DIAGNOSIS — J069 Acute upper respiratory infection, unspecified: Secondary | ICD-10-CM | POA: Insufficient documentation

## 2016-08-01 DIAGNOSIS — R0981 Nasal congestion: Secondary | ICD-10-CM | POA: Diagnosis present

## 2016-08-01 DIAGNOSIS — O99511 Diseases of the respiratory system complicating pregnancy, first trimester: Secondary | ICD-10-CM | POA: Insufficient documentation

## 2016-08-01 DIAGNOSIS — Z3A08 8 weeks gestation of pregnancy: Secondary | ICD-10-CM | POA: Diagnosis not present

## 2016-08-01 DIAGNOSIS — R6889 Other general symptoms and signs: Secondary | ICD-10-CM | POA: Diagnosis not present

## 2016-08-01 LAB — INFLUENZA PANEL BY PCR (TYPE A & B)
Influenza A By PCR: NEGATIVE
Influenza B By PCR: NEGATIVE

## 2016-08-01 MED ORDER — BENZONATATE 100 MG PO CAPS: 100.0000 mg | ORAL_CAPSULE | Freq: Three times a day (TID) | ORAL | 0 refills | Status: DC

## 2016-08-01 MED ORDER — GUAIFENESIN-CODEINE 100-10 MG/5ML PO SOLN: 5.0000 mL | Freq: Every evening | ORAL | 0 refills | Status: DC | PRN

## 2016-08-01 NOTE — MAU Provider Note (Signed)
History     CSN: [id number]  Arrival date and time: 08/01/16 1306   None     Chief Complaint  Patient presents with  . Nasal Congestion  . Cough   HPI   Ms.Ornella Fluhr is a 21 y.o. female G1P0 @ [redacted]w[redacted]d here in MAU with complaints of cough, sore throat, and congestion X 1 week. No one else in her house is sick. She has tried sudafed, and allergy medicine. With only some relief.   No fever at home.  Patient states cough is keeping her up at night.   OB History    Gravida Para Term Preterm AB Living   1             SAB TAB Ectopic Multiple Live Births                  No past medical history on file.  No past surgical history on file.  Family History  Problem Relation Age of Onset  . Hypertension Mother   . Hyperlipidemia Mother   . Hypertension Father   . Heart failure Father   . Diabetes Maternal Grandmother   . Hyperlipidemia Maternal Grandmother   . Hypertension Maternal Grandmother   . Hypertension Maternal Grandfather   . Diabetes Paternal Grandmother   . Hypertension Paternal Grandmother   . Hypertension Paternal Grandfather   . Heart failure Paternal Grandfather     Social History  Substance Use Topics  . Smoking status: Never Smoker  . Smokeless tobacco: Never Used  . Alcohol use No     Comment: socially    Allergies: No Known Allergies  Prescriptions Prior to Admission  Medication Sig Dispense Refill Last Dose  . acetaminophen (TYLENOL) 325 MG tablet Take 650 mg by mouth every 6 (six) hours as needed for mild pain or headache.   Past Week at Unknown time  . Prenatal Vit-Fe Fumarate-FA (PRENATAL MULTIVITAMIN) TABS tablet Take 1 tablet by mouth daily at 12 noon.   07/19/2016 at Unknown time   Results for orders placed or performed during the hospital encounter of 08/01/16 (from the past 48 hour(s))  Influenza panel by PCR (type A & B)     Status: None   Collection Time: 08/01/16  2:13 PM  Result Value Ref Range   Influenza A By PCR NEGATIVE  NEGATIVE   Influenza B By PCR NEGATIVE NEGATIVE    Comment: (NOTE) The Xpert Xpress Flu assay is intended as an aid in the diagnosis of  influenza and should not be used as a sole basis for treatment.  This  assay is FDA approved for nasopharyngeal swab specimens only. Nasal  washings and aspirates are unacceptable for Xpert Xpress Flu testing.    Review of Systems  Constitutional: Negative for fever.  HENT: Positive for congestion and sore throat.   Respiratory: Positive for cough and shortness of breath.    Physical Exam   Blood pressure 121/70, pulse 98, temperature 97.4 F (36.3 C), temperature source Oral, resp. rate 16, last menstrual period 05/22/2016, SpO2 98 %.  Physical Exam  Constitutional: She is oriented to person, place, and time. She appears well-developed and well-nourished.  Non-toxic appearance. She has a sickly appearance. She does not appear ill. No distress.  HENT:  Head: Normocephalic.  Cardiovascular: Normal rate.   Respiratory: Effort normal and breath sounds normal.  Musculoskeletal: Normal range of motion.  Neurological: She is alert and oriented to person, place, and time.  Skin: Skin is warm. She  is not diaphoretic.  Psychiatric: Her behavior is normal.   MAU Course  Procedures  None  MDM  Due to longevity of symptoms; influenza swab collected, low suspicion with absent fever, no tachycardia.  Assessment and Plan   A:  1. Flu-like symptoms   2. Upper respiratory virus     P:  Discharge home in stable condition Patient notified of influenza results Rx: Codeine cough, Tessalon pearles Follow up with OB/PCP If symptoms worsen go to Urgent care Cool mist humidifier    Duane Lope, NP 08/01/2016 3:52 PM

## 2016-08-01 NOTE — Discharge Instructions (Signed)
Cool Mist Vaporizer A cool mist vaporizer is a device that releases a cool mist into the air. If you have a cough or a cold, using a vaporizer may help relieve your symptoms. The mist adds moisture to the air, which may help thin your mucus and make it less sticky. When your mucus is thin and less sticky, it easier for you to breathe and to cough up secretions. Do not use a vaporizer if you are allergic to mold. Follow these instructions at home:  Follow the instructions that come with the vaporizer.  Do not use anything other than distilled water in the vaporizer.  Do not run the vaporizer all of the time. Doing that can cause mold or bacteria to grow in the vaporizer.  Clean the vaporizer after each time that you use it.  Clean and dry the vaporizer well before storing it.  Stop using the vaporizer if your breathing symptoms get worse. This information is not intended to replace advice given to you by your health care provider. Make sure you discuss any questions you have with your health care provider. Document Released: 12/29/2003 Document Revised: 10/21/2015 Document Reviewed: 07/02/2015 Elsevier Interactive Patient Education  2017 [employer]   Cough, Adult A cough helps to clear your throat and lungs. A cough may last only 2-3 weeks (acute), or it may last longer than 8 weeks (chronic). Many different things can cause a cough. A cough may be a sign of an illness or another medical condition. Follow these instructions at home:  Pay attention to any changes in your cough.  Take medicines only as told by your doctor.  If you were prescribed an antibiotic medicine, take it as told by your doctor. Do not stop taking it even if you start to feel better.  Talk with your doctor before you try using a cough medicine.  Drink enough fluid to keep your pee (urine) clear or pale yellow.  If the air is dry, use a cold steam vaporizer or humidifier in your home.  Stay away from things  that make you cough at work or at home.  If your cough is worse at night, try using extra pillows to raise your head up higher while you sleep.  Do not smoke, and try not to be around smoke. If you need help quitting, ask your doctor.  Do not have caffeine.  Do not drink alcohol.  Rest as needed. Contact a doctor if:  You have new problems (symptoms).  You cough up yellow fluid (pus).  Your cough does not get better after 2-3 weeks, or your cough gets worse.  Medicine does not help your cough and you are not sleeping well.  You have pain that gets worse or pain that is not helped with medicine.  You have a fever.  You are losing weight and you do not know why.  You have night sweats. Get help right away if:  You cough up blood.  You have trouble breathing.  Your heartbeat is very fast. This information is not intended to replace advice given to you by your health care provider. Make sure you discuss any questions you have with your health care provider. Document Released: 12/14/2010 Document Revised: 09/08/2015 Document Reviewed: 06/09/2014 Elsevier Interactive Patient Education  2017 [employer].

## 2016-08-01 NOTE — MAU Note (Addendum)
Pt thinks she may have sinus infection, has a non-productive cough.  Allergy medication not helping.  Also has sore throat, is unsure if she has had a fever, is very congested.  Denies abd pain or bleeding.  Has occasional vomiting, no diarrhea.

## 2016-08-03 ENCOUNTER — Encounter (HOSPITAL_COMMUNITY): Payer: Self-pay

## 2016-08-03 DIAGNOSIS — O26891 Other specified pregnancy related conditions, first trimester: Secondary | ICD-10-CM | POA: Insufficient documentation

## 2016-08-03 DIAGNOSIS — R1013 Epigastric pain: Secondary | ICD-10-CM | POA: Insufficient documentation

## 2016-08-03 DIAGNOSIS — Z3A10 10 weeks gestation of pregnancy: Secondary | ICD-10-CM | POA: Diagnosis not present

## 2016-08-03 DIAGNOSIS — O219 Vomiting of pregnancy, unspecified: Secondary | ICD-10-CM | POA: Diagnosis not present

## 2016-08-03 LAB — LIPASE, BLOOD: LIPASE: 19 U/L (ref 11–51)

## 2016-08-03 LAB — COMPREHENSIVE METABOLIC PANEL
ALT: 10 U/L — ABNORMAL LOW (ref 14–54)
AST: 17 U/L (ref 15–41)
Albumin: 3.7 g/dL (ref 3.5–5.0)
Alkaline Phosphatase: 54 U/L (ref 38–126)
Anion gap: 8 (ref 5–15)
BILIRUBIN TOTAL: 0.4 mg/dL (ref 0.3–1.2)
CHLORIDE: 104 mmol/L (ref 101–111)
CO2: 21 mmol/L — ABNORMAL LOW (ref 22–32)
Calcium: 9.2 mg/dL (ref 8.9–10.3)
Creatinine, Ser: 0.5 mg/dL (ref 0.44–1.00)
GFR calc non Af Amer: >60 mL/min (ref >60)
Glucose, Bld: 96 mg/dL (ref 65–99)
POTASSIUM: 3.6 mmol/L (ref 3.5–5.1)
Sodium: 133 mmol/L — ABNORMAL LOW (ref 135–145)
TOTAL PROTEIN: 7.1 g/dL (ref 6.5–8.1)

## 2016-08-03 LAB — CBC
HEMATOCRIT: 36.7 % (ref 36.0–46.0)
Hemoglobin: 12.3 g/dL (ref 12.0–15.0)
MCH: 25.9 pg — ABNORMAL LOW (ref 26.0–34.0)
MCHC: 33.5 g/dL (ref 30.0–36.0)
MCV: 77.3 fL — ABNORMAL LOW (ref 78.0–100.0)
PLATELETS: 320 10*3/uL (ref 150–400)
RBC: 4.75 MIL/uL (ref 3.87–5.11)
RDW: 15.5 % (ref 11.5–15.5)
WBC: 9.6 10*3/uL (ref 4.0–10.5)

## 2016-08-03 LAB — URINALYSIS, ROUTINE W REFLEX MICROSCOPIC
Bilirubin Urine: NEGATIVE
Glucose, UA: NEGATIVE mg/dL
Hgb urine dipstick: NEGATIVE
KETONES UR: NEGATIVE mg/dL
LEUKOCYTES UA: NEGATIVE
NITRITE: NEGATIVE
PH: 5 (ref 5.0–8.0)
PROTEIN: NEGATIVE mg/dL
Specific Gravity, Urine: 1.012 (ref 1.005–1.030)

## 2016-08-03 NOTE — ED Triage Notes (Signed)
Pt states that she is [redacted] weeks pregnant and ate a sandwich, vomited about 3 times immediately after, nausea continues, as well as upper abd pain. Denies fevers

## 2016-08-04 ENCOUNTER — Emergency Department (HOSPITAL_COMMUNITY)
Admission: EM | Admit: 2016-08-04 | Discharge: 2016-08-04 | Disposition: A | Discharge status: Home / Self Care | Payer: BLUE CROSS/BLUE SHIELD | Attending: Emergency Medicine | Admitting: Emergency Medicine

## 2016-08-04 DIAGNOSIS — O219 Vomiting of pregnancy, unspecified: Secondary | ICD-10-CM

## 2016-08-04 DIAGNOSIS — R1013 Epigastric pain: Secondary | ICD-10-CM

## 2016-08-04 MED ORDER — METOCLOPRAMIDE HCL 5 MG/ML IJ SOLN
10.0000 mg | Freq: Once | INTRAMUSCULAR | Status: AC
  Administered 2016-08-04: 10 mg via INTRAVENOUS
  Filled 2016-08-04: qty 2

## 2016-08-04 MED ORDER — DIPHENHYDRAMINE HCL 50 MG/ML IJ SOLN
25.0000 mg | Freq: Once | INTRAMUSCULAR | Status: AC
  Administered 2016-08-04: 25 mg via INTRAVENOUS
  Filled 2016-08-04: qty 1

## 2016-08-04 MED ORDER — ALUM & MAG HYDROXIDE-SIMETH 200-200-20 MG/5ML PO SUSP
15.0000 mL | Freq: Once | ORAL | Status: AC
  Administered 2016-08-04: 15 mL via ORAL
  Filled 2016-08-04: qty 30

## 2016-08-04 MED ORDER — SODIUM CHLORIDE 0.9 % IV BOLUS (SEPSIS)
1000.0000 mL | Freq: Once | INTRAVENOUS | Status: AC
  Administered 2016-08-04: 1000 mL via INTRAVENOUS

## 2016-08-04 NOTE — ED Provider Notes (Signed)
[facility]-EMERGENCY DEPT Provider Note   CSN: [id number] Arrival date & time: 08/03/16  2143   By signing my name below, I, Clovis Pu, attest that this documentation has been prepared under the direction and in the presence of Gilda Crease, MD  Electronically Signed: Clovis Pu, ED Scribe. 08/04/16. 1:31 AM.   History   Chief Complaint Chief Complaint  Patient presents with  . Abdominal Pain  . Emesis During Pregnancy    HPI Comments:  Ashley Pruitt is a 21 y.o. female who presents to the Emergency Department complaining of acute onset, intermittent, moderate, upper abdominal pain beginning yesterday. She also reports nausea and 4 episodes of vomiting. Pt states she began vomiting while eating a sandwich. Her pain is worse with palpation. No alleviating factors noted. Pt denies any other associated symptoms. She notes she is [redacted] weeks pregnant. No other complaints noted at this time.   The history is provided by the patient. No language interpreter was used.    History reviewed. No pertinent past medical history.  There are no active problems to display for this patient.   History reviewed. No pertinent surgical history.  OB History    Gravida Para Term Preterm AB Living   1             SAB TAB Ectopic Multiple Live Births                   Home Medications    Prior to Admission medications   Medication Sig Start Date End Date Taking? Authorizing Provider  acetaminophen (TYLENOL) 325 MG tablet Take 650 mg by mouth every 6 (six) hours as needed for mild pain or headache.    Historical Provider, MD  benzonatate (TESSALON) 100 MG capsule Take 1 capsule (100 mg total) by mouth every 8 (eight) hours. 08/01/16   Duane Lope, NP  guaiFENesin-codeine 100-10 MG/5ML syrup Take 5 mLs by mouth at bedtime as needed for cough. 08/01/16   Duane Lope, NP  Prenatal Vit-Fe Fumarate-FA (PRENATAL MULTIVITAMIN) TABS tablet Take 1 tablet by mouth daily at 12 noon.     Historical Provider, MD    Family History Family History  Problem Relation Age of Onset  . Hypertension Mother   . Hyperlipidemia Mother   . Hypertension Father   . Heart failure Father   . Diabetes Maternal Grandmother   . Hyperlipidemia Maternal Grandmother   . Hypertension Maternal Grandmother   . Hypertension Maternal Grandfather   . Diabetes Paternal Grandmother   . Hypertension Paternal Grandmother   . Hypertension Paternal Grandfather   . Heart failure Paternal Grandfather     Social History Social History  Substance Use Topics  . Smoking status: Never Smoker  . Smokeless tobacco: Never Used  . Alcohol use No     Comment: socially     Allergies   Patient has no known allergies.   Review of Systems Review of Systems  Constitutional: Negative for fever.  Gastrointestinal: Positive for abdominal pain, nausea and vomiting.  All other systems reviewed and are negative.  Physical Exam Updated Vital Signs BP 124/80   Pulse 86   Temp 99 F (37.2 C) (Oral)   Resp 18   Ht  (1.575 m)   Wt 198 lb (89.8 kg)   LMP 05/22/2016   SpO2 99%   BMI 36.21 kg/m   Physical Exam  Constitutional: She is oriented to person, place, and time. She appears well-developed and well-nourished. No distress.  HENT:  Head: Normocephalic and atraumatic.  Right Ear: Hearing normal.  Left Ear: Hearing normal.  Nose: Nose normal.  Mouth/Throat: Oropharynx is clear and moist and mucous membranes are normal.  Eyes: Conjunctivae and EOM are normal. Pupils are equal, round, and reactive to light.  Neck: Normal range of motion. Neck supple.  Cardiovascular: Regular rhythm, S1 normal and S2 normal.  Exam reveals no gallop and no friction rub.   No murmur heard. Pulmonary/Chest: Effort normal and breath sounds normal. No respiratory distress. She exhibits no tenderness.  Abdominal: Soft. Normal appearance and bowel sounds are normal. There is no hepatosplenomegaly. There is tenderness  in the epigastric area. There is no rebound, no guarding, no tenderness at McBurney's point and negative Murphy's sign. No hernia.  Musculoskeletal: Normal range of motion.  Neurological: She is alert and oriented to person, place, and time. She has normal strength. No cranial nerve deficit or sensory deficit. Coordination normal. GCS eye subscore is 4. GCS verbal subscore is 5. GCS motor subscore is 6.  Skin: Skin is warm, dry and intact. No rash noted. No cyanosis.  Psychiatric: She has a normal mood and affect. Her speech is normal and behavior is normal. Thought content normal.  Nursing note and vitals reviewed.    ED Treatments / Results  DIAGNOSTIC STUDIES:  Oxygen Saturation is 99% on RA, normal by my interpretation.    COORDINATION OF CARE:  1:30 AM Discussed treatment plan with pt at bedside and pt agreed to plan.  Labs (all labs ordered are listed, but only abnormal results are displayed) Labs Reviewed  COMPREHENSIVE METABOLIC PANEL - Abnormal; Notable for the following:       Result Value   Sodium 133 (*)    CO2 21 (*)    BUN <5 (*)    ALT 10 (*)    All other components within normal limits  CBC - Abnormal; Notable for the following:    MCV 77.3 (*)    MCH 25.9 (*)    All other components within normal limits  URINALYSIS, ROUTINE W REFLEX MICROSCOPIC - Abnormal; Notable for the following:    APPearance HAZY (*)    All other components within normal limits  LIPASE, BLOOD    EKG  EKG Interpretation None       Radiology No results found.  Procedures Procedures (including critical care time)  Medications Ordered in ED Medications  sodium chloride 0.9 % bolus 1,000 mL (1,000 mLs Intravenous New Bag/Given 08/04/16 0213)  metoCLOPramide (REGLAN) injection 10 mg (10 mg Intravenous Given 08/04/16 0211)  diphenhydrAMINE (BENADRYL) injection 25 mg (25 mg Intravenous Given 08/04/16 0210)  alum & mag hydroxide-simeth (MAALOX/MYLANTA) 200-200-20 MG/5ML suspension 15  mL (15 mLs Oral Given 08/04/16 0212)     Initial Impression / Assessment and Plan / ED Course  I have reviewed the triage vital signs and the nursing notes.  Pertinent labs & imaging results that were available during my care of the patient were reviewed by me and considered in my medical decision making (see chart for details).     Patient presents with complaints of abdominal pain with nausea and vomiting. Patient is approximately [redacted] weeks pregnant. She has not had any problems with her pregnancy thus far. Patient reports that she ate half of a [employer] sandwich and then started having discomfort in her upper abdomen followed by nausea and vomiting. Patient has not had any pain or discomfort below the umbilicus. She denies bleeding, discharge, fluid rash. Symptoms not  consistent with obstetric complication. She was administered antacid, IV fluids, Reglan. Symptoms have resolved. Examination revealed tenderness in the epigastric region initially, minimal right upper quadrant tenderness. No Murphy sign. Normal liver function test. Doubt gallbladder disease. Repeat examination after treatment reveals no tenderness. Patient will therefore be discharged, follow-up with her OB/GYN.  Final Clinical Impressions(s) / ED Diagnoses   Final diagnoses:  Nausea and vomiting in pregnancy  Epigastric pain    New Prescriptions New Prescriptions   No medications on file  I personally performed the services described in this documentation, which was scribed in my presence. The recorded information has been reviewed and is accurate.     Gilda Crease, MD 08/04/16 [id number]

## 2017-01-29 ENCOUNTER — Inpatient Hospital Stay (HOSPITAL_COMMUNITY)
Admission: AD | Admit: 2017-01-29 | Discharge: 2017-01-29 | Disposition: A | Discharge status: Home / Self Care | Payer: BLUE CROSS/BLUE SHIELD | Source: Ambulatory Visit | Attending: Obstetrics and Gynecology | Admitting: Obstetrics and Gynecology

## 2017-01-29 DIAGNOSIS — Z3A34 34 weeks gestation of pregnancy: Secondary | ICD-10-CM | POA: Diagnosis not present

## 2017-01-29 DIAGNOSIS — O26893 Other specified pregnancy related conditions, third trimester: Secondary | ICD-10-CM | POA: Diagnosis not present

## 2017-01-29 DIAGNOSIS — R109 Unspecified abdominal pain: Secondary | ICD-10-CM | POA: Diagnosis present

## 2017-01-29 DIAGNOSIS — O26899 Other specified pregnancy related conditions, unspecified trimester: Secondary | ICD-10-CM

## 2017-01-29 LAB — WET PREP, GENITAL
CLUE CELLS WET PREP: NONE SEEN
SPERM: NONE SEEN
TRICH WET PREP: NONE SEEN

## 2017-01-29 LAB — GROUP B STREP BY PCR: GROUP B STREP BY PCR: NEGATIVE

## 2017-01-29 NOTE — MAU Provider Note (Signed)
DATE: 01/29/2017  Maternity Admissions Unit History and Physical Exam for an Obstetrics Patient  [name] is a 20 y.o. female, G1P0, at [redacted]w[redacted]d gestation, who presents for evaluation of abdominal pain. She was at work today and had a sharp pain in her lower back and her lower abdomen. That pain has since resolved. She had one episode of leakage of fluid that may have been urine. She denies bleeding and leakage of fluid now. She has been followed at the [facility] and Gynecology division of [facility]. See history below.  OB History    Gravida Para Term Preterm AB Living   1             SAB TAB Ectopic Multiple Live Births                  No past medical history on file.  Prescriptions Prior to Admission  Medication Sig Dispense Refill Last Dose  . acetaminophen (TYLENOL) 325 MG tablet Take 650 mg by mouth every 6 (six) hours as needed for mild pain or headache.   01/28/2017 at Unknown time  . Prenatal Vit-Fe Fumarate-FA (PRENATAL MULTIVITAMIN) TABS tablet Take 1 tablet by mouth daily after breakfast.    01/29/2017 at Unknown time    No past surgical history on file.  No Known Allergies  Family History: family history includes Diabetes in her maternal grandmother and paternal grandmother; Heart failure in her father and paternal grandfather; Hyperlipidemia in her maternal grandmother and mother; Hypertension in her father, maternal grandfather, maternal grandmother, mother, paternal grandfather, and paternal grandmother.  Social History:  reports that she has never smoked. She has never used smokeless tobacco. She reports that she does not drink alcohol or use drugs.  Review of systems: Normal pregnancy complaints.  Admission Physical Exam:  Dilation: Closed Effacement (%): Thick Exam by:: Dr Stefano Gaul Body mass index is 38.23 kg/m.  Blood pressure 108/68, pulse 98, temperature 98.6 F (37 C), temperature source Oral, resp. rate 18,  height  (1.575 m), weight 94.8 kg (209 lb), last menstrual period 05/22/2016, SpO2 100 %.  HEENT:                 Within normal limits Chest:                   Clear Heart:                    Regular rate and rhythm Abdomen:             Gravid and nontender Extremities:          Grossly normal Neurologic exam: Grossly normal Pelvic exam:         Cervix: closed, long, -4  No results found for this or any previous visit (from the past 24 hour(s)).   Prenatal labs: ABO, Rh:             --/--/A POS (04/05 1729)  NST: Category 1; Contractions: none .   Assessment:  [redacted]w[redacted]d gestation  Abdominal pain with no evidence of pathology. No pain now.  Plan:  I reviewed abdominal pain in pregnancy. The patient will call for questions or concerns. She will follow up in the office in one week.  Gonorrhea, Chlamydia, beta strep, and wet prep sent to the lab. We will follow up on an outpatient basis.   Janine Limbo 01/29/2017, 2:14 PM

## 2017-01-29 NOTE — Discharge Instructions (Signed)

## 2017-01-29 NOTE — MAU Note (Signed)
Having pain in lower back and sharp pains in lower stomach.  Was having Braxton Hicks yesterday.  When got to work today, the pains started and are getting worse.  Is having to pee a lot, panties were wet when got home, ? leaking

## 2017-01-30 LAB — GC/CHLAMYDIA PROBE AMP (~~LOC~~) NOT AT ARMC
CHLAMYDIA, DNA PROBE: NEGATIVE
Neisseria Gonorrhea: NEGATIVE

## 2017-02-22 ENCOUNTER — Inpatient Hospital Stay (HOSPITAL_COMMUNITY)
Admission: AD | Admit: 2017-02-22 | Discharge: 2017-02-22 | Disposition: A | Discharge status: Home / Self Care | Payer: BLUE CROSS/BLUE SHIELD | Source: Ambulatory Visit | Attending: Obstetrics and Gynecology | Admitting: Obstetrics and Gynecology

## 2017-02-22 ENCOUNTER — Encounter (HOSPITAL_COMMUNITY): Payer: Self-pay | Admitting: *Deleted

## 2017-02-22 ENCOUNTER — Inpatient Hospital Stay (HOSPITAL_COMMUNITY)
Admission: AD | Admit: 2017-02-22 | Discharge: 2017-02-22 | Disposition: A | Discharge status: Home / Self Care | Payer: BLUE CROSS/BLUE SHIELD | Source: Ambulatory Visit | Attending: Obstetrics & Gynecology | Admitting: Obstetrics & Gynecology

## 2017-02-22 DIAGNOSIS — Z3A37 37 weeks gestation of pregnancy: Secondary | ICD-10-CM

## 2017-02-22 DIAGNOSIS — O479 False labor, unspecified: Secondary | ICD-10-CM

## 2017-02-22 MED ORDER — MORPHINE SULFATE (PF) 4 MG/ML IV SOLN
4.0000 mg | Freq: Once | INTRAVENOUS | Status: AC
  Administered 2017-02-22: 4 mg via INTRAMUSCULAR
  Filled 2017-02-22: qty 1

## 2017-02-22 MED ORDER — ZOLPIDEM TARTRATE 5 MG PO TABS
5.0000 mg | ORAL_TABLET | Freq: Once | ORAL | Status: AC
  Administered 2017-02-22: 5 mg via ORAL
  Filled 2017-02-22: qty 1

## 2017-02-22 NOTE — Progress Notes (Signed)
I have communicated with Gerrit HeckJessica Emly, cnm and reviewed vital signs:  Vitals:   02/22/17 1924 02/22/17 2004  BP: 125/72 106/68  Pulse: (!) 103   Resp: 20 20  Temp: 98.1 F (36.7 C)     Vaginal exam:  Dilation: 1.5 Effacement (%): 60 Cervical Position: Middle Station: -2 Exam by:: Danicia Terhaar robisnon rn,   Also reviewed contraction pattern and that non-stress test is reactive.  It has been documented that patient is contracting every 3-4 minutes with no cervical change over 10 hours not indicating active labor.  Patient denies any other complaints.  Based on this report provider has given order for discharge.  A discharge order and diagnosis entered by a provider.   Labor discharge instructions reviewed with patient.

## 2017-02-22 NOTE — MAU Note (Signed)
Seen this am at 5. Contractions continued.  Denies lof, vaginal bleeding. + fm

## 2017-02-22 NOTE — Discharge Instructions (Signed)

## 2017-02-22 NOTE — MAU Note (Signed)
PT  SAYS SHE  STARTED HURTING  WITH UC'S  SINCE 0500.    PNC WITH  [facility]-   VE -CLOSED ON WED.  DENIES HSV AND  MRSA. GBS- NEG

## 2017-02-22 NOTE — MAU Note (Signed)
Notified Dr. Normand Sloopillard patient here for labor evaluation, was 1 cm, now loose 1 cm contractions every 6 to 8, fhr reactive, received discharge order, MD not where she can put in clinical impression.

## 2017-02-23 ENCOUNTER — Inpatient Hospital Stay (HOSPITAL_COMMUNITY): Payer: BLUE CROSS/BLUE SHIELD | Admitting: Anesthesiology

## 2017-02-23 ENCOUNTER — Encounter (HOSPITAL_COMMUNITY)
Admission: AD | Disposition: A | Discharge status: Home / Self Care | Payer: Self-pay | Source: Ambulatory Visit | Attending: Obstetrics and Gynecology

## 2017-02-23 ENCOUNTER — Encounter (HOSPITAL_COMMUNITY): Payer: Self-pay

## 2017-02-23 ENCOUNTER — Other Ambulatory Visit: Payer: Self-pay

## 2017-02-23 ENCOUNTER — Inpatient Hospital Stay (HOSPITAL_COMMUNITY)
Admission: AD | Admit: 2017-02-23 | Discharge: 2017-02-26 | DRG: 787 | Disposition: A | Discharge status: Home / Self Care | Payer: BLUE CROSS/BLUE SHIELD | Source: Ambulatory Visit | Attending: Obstetrics and Gynecology | Admitting: Obstetrics and Gynecology

## 2017-02-23 DIAGNOSIS — Z3483 Encounter for supervision of other normal pregnancy, third trimester: Secondary | ICD-10-CM | POA: Diagnosis present

## 2017-02-23 DIAGNOSIS — D62 Acute posthemorrhagic anemia: Secondary | ICD-10-CM | POA: Diagnosis not present

## 2017-02-23 DIAGNOSIS — Z3A37 37 weeks gestation of pregnancy: Secondary | ICD-10-CM | POA: Diagnosis not present

## 2017-02-23 DIAGNOSIS — O99214 Obesity complicating childbirth: Secondary | ICD-10-CM | POA: Diagnosis present

## 2017-02-23 DIAGNOSIS — O9081 Anemia of the puerperium: Secondary | ICD-10-CM | POA: Diagnosis not present

## 2017-02-23 DIAGNOSIS — Z98891 History of uterine scar from previous surgery: Secondary | ICD-10-CM

## 2017-02-23 LAB — CBC
HEMATOCRIT: 30.7 % — AB (ref 36.0–46.0)
HEMOGLOBIN: 10 g/dL — AB (ref 12.0–15.0)
MCH: 22.5 pg — AB (ref 26.0–34.0)
MCHC: 32.6 g/dL (ref 30.0–36.0)
MCV: 69.1 fL — AB (ref 78.0–100.0)
Platelets: 231 10*3/uL (ref 150–400)
RBC: 4.44 MIL/uL (ref 3.87–5.11)
RDW: 16.7 % — ABNORMAL HIGH (ref 11.5–15.5)
WBC: 9.5 10*3/uL (ref 4.0–10.5)

## 2017-02-23 LAB — TYPE AND SCREEN
ABO/RH(D): A POS
Antibody Screen: NEGATIVE

## 2017-02-23 LAB — RPR: RPR Ser Ql: NONREACTIVE

## 2017-02-23 SURGERY — Surgical Case
Anesthesia: Epidural

## 2017-02-23 MED ORDER — ONDANSETRON HCL 4 MG/2ML IJ SOLN
INTRAMUSCULAR | Status: AC
  Filled 2017-02-23: qty 2

## 2017-02-23 MED ORDER — EPHEDRINE 5 MG/ML INJ
10.0000 mg | INTRAVENOUS | Status: DC | PRN
  Filled 2017-02-23: qty 2

## 2017-02-23 MED ORDER — MENTHOL 3 MG MT LOZG
1.0000 | LOZENGE | OROMUCOSAL | Status: DC | PRN
Start: 2017-02-23 — End: 2017-02-26

## 2017-02-23 MED ORDER — CEFAZOLIN SODIUM-DEXTROSE 2-3 GM-%(50ML) IV SOLR
INTRAVENOUS | Status: DC | PRN
  Administered 2017-02-23: 2 g via INTRAVENOUS

## 2017-02-23 MED ORDER — OXYTOCIN 40 UNITS IN LACTATED RINGERS INFUSION - SIMPLE MED: 2.5000 [IU]/h | INTRAVENOUS | Status: DC

## 2017-02-23 MED ORDER — LACTATED RINGERS IV SOLN
INTRAVENOUS | Status: DC
  Administered 2017-02-23 (×2): via INTRAVENOUS

## 2017-02-23 MED ORDER — OXYTOCIN BOLUS FROM INFUSION: 500.0000 mL | Freq: Once | INTRAVENOUS | Status: DC

## 2017-02-23 MED ORDER — OXYTOCIN 10 UNIT/ML IJ SOLN
INTRAMUSCULAR | Status: AC
  Filled 2017-02-23: qty 4

## 2017-02-23 MED ORDER — SIMETHICONE 80 MG PO CHEW
80.0000 mg | CHEWABLE_TABLET | ORAL | Status: DC
Start: 2017-02-24 — End: 2017-02-26
  Administered 2017-02-23 – 2017-02-25 (×3): 80 mg via ORAL
  Filled 2017-02-23 (×3): qty 1

## 2017-02-23 MED ORDER — TETANUS-DIPHTH-ACELL PERTUSSIS 5-2.5-18.5 LF-MCG/0.5 IM SUSP: 0.5000 mL | Freq: Once | INTRAMUSCULAR | Status: DC

## 2017-02-23 MED ORDER — ACETAMINOPHEN 325 MG PO TABS
650.0000 mg | ORAL_TABLET | ORAL | Status: DC | PRN
  Administered 2017-02-23: 650 mg via ORAL
  Filled 2017-02-23: qty 2

## 2017-02-23 MED ORDER — LACTATED RINGERS IV SOLN
INTRAVENOUS | Status: DC | PRN
  Administered 2017-02-23 (×2): via INTRAVENOUS

## 2017-02-23 MED ORDER — ZOLPIDEM TARTRATE 5 MG PO TABS: 5.0000 mg | ORAL_TABLET | Freq: Every evening | ORAL | Status: DC | PRN

## 2017-02-23 MED ORDER — PHENYLEPHRINE 40 MCG/ML (10ML) SYRINGE FOR IV PUSH (FOR BLOOD PRESSURE SUPPORT)
80.0000 ug | PREFILLED_SYRINGE | INTRAVENOUS | Status: DC | PRN
  Filled 2017-02-23: qty 5

## 2017-02-23 MED ORDER — LIDOCAINE HCL (PF) 1 % IJ SOLN
INTRAMUSCULAR | Status: DC | PRN
  Administered 2017-02-23 (×2): 4 mL via EPIDURAL

## 2017-02-23 MED ORDER — CEFAZOLIN SODIUM-DEXTROSE 2-3 GM-%(50ML) IV SOLR
INTRAVENOUS | Status: AC
  Filled 2017-02-23: qty 50

## 2017-02-23 MED ORDER — SODIUM BICARBONATE 8.4 % IV SOLN
INTRAVENOUS | Status: AC
  Filled 2017-02-23: qty 50

## 2017-02-23 MED ORDER — OXYTOCIN 40 UNITS IN LACTATED RINGERS INFUSION - SIMPLE MED
1.0000 m[IU]/min | INTRAVENOUS | Status: DC
  Administered 2017-02-23: 2 m[IU]/min via INTRAVENOUS
  Filled 2017-02-23: qty 1000

## 2017-02-23 MED ORDER — PHENYLEPHRINE 40 MCG/ML (10ML) SYRINGE FOR IV PUSH (FOR BLOOD PRESSURE SUPPORT)
80.0000 ug | PREFILLED_SYRINGE | INTRAVENOUS | Status: DC | PRN
  Administered 2017-02-23: 80 ug via INTRAVENOUS
  Filled 2017-02-23: qty 5
  Filled 2017-02-23: qty 10

## 2017-02-23 MED ORDER — COCONUT OIL OIL: 1.0000 "application " | TOPICAL_OIL | Status: DC | PRN

## 2017-02-23 MED ORDER — LACTATED RINGERS IV SOLN
INTRAVENOUS | Status: DC | PRN
  Administered 2017-02-23: 14:00:00 via INTRAVENOUS

## 2017-02-23 MED ORDER — SCOPOLAMINE 1 MG/3DAYS TD PT72
MEDICATED_PATCH | TRANSDERMAL | Status: DC | PRN
  Administered 2017-02-23: 1 via TRANSDERMAL

## 2017-02-23 MED ORDER — SCOPOLAMINE 1 MG/3DAYS TD PT72
MEDICATED_PATCH | TRANSDERMAL | Status: AC
  Filled 2017-02-23: qty 1

## 2017-02-23 MED ORDER — ACETAMINOPHEN 325 MG PO TABS: 650.0000 mg | ORAL_TABLET | ORAL | Status: DC | PRN

## 2017-02-23 MED ORDER — TERBUTALINE SULFATE 1 MG/ML IJ SOLN
0.2500 mg | Freq: Once | INTRAMUSCULAR | Status: DC | PRN
  Filled 2017-02-23: qty 1

## 2017-02-23 MED ORDER — LACTATED RINGERS IV SOLN
500.0000 mL | INTRAVENOUS | Status: DC | PRN
  Administered 2017-02-23 (×2): 1000 mL via INTRAVENOUS

## 2017-02-23 MED ORDER — FENTANYL 2.5 MCG/ML BUPIVACAINE 1/10 % EPIDURAL INFUSION (WH - ANES)
14.0000 mL/h | INTRAMUSCULAR | Status: DC | PRN
  Administered 2017-02-23: 12 mL/h via EPIDURAL
  Filled 2017-02-23: qty 100

## 2017-02-23 MED ORDER — SODIUM CHLORIDE 0.9 % IR SOLN
Status: DC | PRN
  Administered 2017-02-23: 1

## 2017-02-23 MED ORDER — OXYTOCIN 10 UNIT/ML IJ SOLN
INTRAVENOUS | Status: DC | PRN
  Administered 2017-02-23: 40 [IU] via INTRAVENOUS

## 2017-02-23 MED ORDER — ONDANSETRON HCL 4 MG/2ML IJ SOLN: 4.0000 mg | Freq: Four times a day (QID) | INTRAMUSCULAR | Status: DC | PRN

## 2017-02-23 MED ORDER — ONDANSETRON HCL 4 MG/2ML IJ SOLN
INTRAMUSCULAR | Status: DC | PRN
  Administered 2017-02-23: 4 mg via INTRAVENOUS

## 2017-02-23 MED ORDER — SOD CITRATE-CITRIC ACID 500-334 MG/5ML PO SOLN
30.0000 mL | ORAL | Status: DC | PRN
  Administered 2017-02-23: 30 mL via ORAL
  Filled 2017-02-23: qty 15

## 2017-02-23 MED ORDER — LIDOCAINE-EPINEPHRINE (PF) 2 %-1:200000 IJ SOLN
INTRAMUSCULAR | Status: AC
  Filled 2017-02-23: qty 20

## 2017-02-23 MED ORDER — SODIUM BICARBONATE 8.4 % IV SOLN
INTRAVENOUS | Status: DC | PRN
  Administered 2017-02-23 (×3): 5 mL via EPIDURAL

## 2017-02-23 MED ORDER — WITCH HAZEL-GLYCERIN EX PADS: 1.0000 "application " | MEDICATED_PAD | CUTANEOUS | Status: DC | PRN

## 2017-02-23 MED ORDER — DIBUCAINE 1 % RE OINT: 1.0000 "application " | TOPICAL_OINTMENT | RECTAL | Status: DC | PRN

## 2017-02-23 MED ORDER — IBUPROFEN 600 MG PO TABS
600.0000 mg | ORAL_TABLET | Freq: Four times a day (QID) | ORAL | Status: DC
  Administered 2017-02-23 – 2017-02-26 (×11): 600 mg via ORAL
  Filled 2017-02-23 (×11): qty 1

## 2017-02-23 MED ORDER — LIDOCAINE HCL (PF) 1 % IJ SOLN: 30.0000 mL | INTRAMUSCULAR | Status: DC | PRN

## 2017-02-23 MED ORDER — SIMETHICONE 80 MG PO CHEW
80.0000 mg | CHEWABLE_TABLET | Freq: Three times a day (TID) | ORAL | Status: DC
  Administered 2017-02-24 – 2017-02-26 (×7): 80 mg via ORAL
  Filled 2017-02-23 (×7): qty 1

## 2017-02-23 MED ORDER — DIPHENHYDRAMINE HCL 25 MG PO CAPS
25.0000 mg | ORAL_CAPSULE | Freq: Four times a day (QID) | ORAL | Status: DC | PRN
  Administered 2017-02-24: 25 mg via ORAL
  Filled 2017-02-23: qty 1

## 2017-02-23 MED ORDER — SIMETHICONE 80 MG PO CHEW: 80.0000 mg | CHEWABLE_TABLET | ORAL | Status: DC | PRN

## 2017-02-23 MED ORDER — MORPHINE SULFATE (PF) 0.5 MG/ML IJ SOLN
INTRAMUSCULAR | Status: AC
  Filled 2017-02-23: qty 10

## 2017-02-23 MED ORDER — LACTATED RINGERS IV SOLN
500.0000 mL | Freq: Once | INTRAVENOUS | Status: AC
  Administered 2017-02-23: 500 mL via INTRAVENOUS

## 2017-02-23 MED ORDER — LACTATED RINGERS IV SOLN
INTRAVENOUS | Status: DC
  Administered 2017-02-23: 18:00:00 via INTRAVENOUS

## 2017-02-23 MED ORDER — OXYTOCIN 40 UNITS IN LACTATED RINGERS INFUSION - SIMPLE MED: 2.5000 [IU]/h | INTRAVENOUS | Status: AC

## 2017-02-23 MED ORDER — MORPHINE SULFATE (PF) 0.5 MG/ML IJ SOLN
INTRAMUSCULAR | Status: DC | PRN
  Administered 2017-02-23: 3 mg via EPIDURAL

## 2017-02-23 MED ORDER — SENNOSIDES-DOCUSATE SODIUM 8.6-50 MG PO TABS
2.0000 | ORAL_TABLET | ORAL | Status: DC
  Administered 2017-02-23 – 2017-02-25 (×3): 2 via ORAL
  Filled 2017-02-23 (×3): qty 2

## 2017-02-23 MED ORDER — PRENATAL MULTIVITAMIN CH
1.0000 | ORAL_TABLET | Freq: Every day | ORAL | Status: DC
  Administered 2017-02-24 – 2017-02-26 (×3): 1 via ORAL
  Filled 2017-02-23 (×3): qty 1

## 2017-02-23 MED ORDER — DIPHENHYDRAMINE HCL 50 MG/ML IJ SOLN: 12.5000 mg | INTRAMUSCULAR | Status: DC | PRN

## 2017-02-23 MED ORDER — LACTATED RINGERS IV SOLN
INTRAVENOUS | Status: DC
  Administered 2017-02-23: 09:00:00 via INTRAUTERINE

## 2017-02-23 MED ORDER — LACTATED RINGERS IV SOLN: 500.0000 mL | Freq: Once | INTRAVENOUS | Status: DC

## 2017-02-23 MED ORDER — FENTANYL CITRATE (PF) 100 MCG/2ML IJ SOLN
50.0000 ug | INTRAMUSCULAR | Status: DC | PRN
  Administered 2017-02-23: 100 ug via INTRAVENOUS
  Administered 2017-02-23: 50 ug via INTRAVENOUS
  Filled 2017-02-23 (×2): qty 2

## 2017-02-23 SURGICAL SUPPLY — 40 items
APL SKNCLS STERI-STRIP NONHPOA (GAUZE/BANDAGES/DRESSINGS) ×1
BENZOIN TINCTURE PRP APPL 2/3 (GAUZE/BANDAGES/DRESSINGS) ×3 IMPLANT
CHLORAPREP W/TINT 26ML (MISCELLANEOUS) ×3 IMPLANT
CLAMP CORD UMBIL (MISCELLANEOUS) IMPLANT
CLOSURE STERI STRIP 1/2 X4 (GAUZE/BANDAGES/DRESSINGS) ×3 IMPLANT
CLOSURE WOUND 1/2 X4 (GAUZE/BANDAGES/DRESSINGS) ×2
CLOTH BEACON ORANGE TIMEOUT ST (SAFETY) ×3 IMPLANT
CONTAINER PREFILL 10% NBF 15ML (MISCELLANEOUS) IMPLANT
DRAIN JACKSON PRT FLT 10 (DRAIN) IMPLANT
DRSG OPSITE POSTOP 4X10 (GAUZE/BANDAGES/DRESSINGS) ×3 IMPLANT
ELECT REM PT RETURN 9FT ADLT (ELECTROSURGICAL) ×3
ELECTRODE REM PT RTRN 9FT ADLT (ELECTROSURGICAL) ×1 IMPLANT
EVACUATOR SILICONE 100CC (DRAIN) IMPLANT
EXTRACTOR VACUUM M CUP 4 TUBE (SUCTIONS) IMPLANT
EXTRACTOR VACUUM M CUP 4' TUBE (SUCTIONS)
GAUZE SPONGE 4X4 12PLY STRL LF (GAUZE/BANDAGES/DRESSINGS) ×3 IMPLANT
GLOVE BIO SURGEON STRL SZ 6.5 (GLOVE) ×2 IMPLANT
GLOVE BIO SURGEONS STRL SZ 6.5 (GLOVE) ×1
GLOVE BIOGEL PI IND STRL 7.0 (GLOVE) ×2 IMPLANT
GLOVE BIOGEL PI INDICATOR 7.0 (GLOVE) ×4
GOWN STRL REUS W/TWL LRG LVL3 (GOWN DISPOSABLE) ×6 IMPLANT
KIT ABG SYR 3ML LUER SLIP (SYRINGE) IMPLANT
NEEDLE HYPO 25X5/8 SAFETYGLIDE (NEEDLE) ×6 IMPLANT
NS IRRIG 1000ML POUR BTL (IV SOLUTION) ×3 IMPLANT
PACK C SECTION WH (CUSTOM PROCEDURE TRAY) ×3 IMPLANT
PAD ABD 7.5X8 STRL (GAUZE/BANDAGES/DRESSINGS) ×3 IMPLANT
PAD OB MATERNITY 4.3X12.25 (PERSONAL CARE ITEMS) ×3 IMPLANT
PENCIL SMOKE EVAC W/HOLSTER (ELECTROSURGICAL) ×3 IMPLANT
RTRCTR C-SECT PINK 25CM LRG (MISCELLANEOUS) IMPLANT
STRIP CLOSURE SKIN 1/2X4 (GAUZE/BANDAGES/DRESSINGS) ×4 IMPLANT
SUT CHROMIC 0 CT 1 (SUTURE) ×3 IMPLANT
SUT MNCRL AB 3-0 PS2 27 (SUTURE) ×3 IMPLANT
SUT PLAIN 2 0 (SUTURE) ×6
SUT PLAIN 2 0 XLH (SUTURE) ×6 IMPLANT
SUT PLAIN ABS 2-0 CT1 27XMFL (SUTURE) ×2 IMPLANT
SUT SILK 2 0 SH (SUTURE) IMPLANT
SUT VIC AB 0 CTX 36 (SUTURE) ×12
SUT VIC AB 0 CTX36XBRD ANBCTRL (SUTURE) ×4 IMPLANT
TOWEL OR 17X24 6PK STRL BLUE (TOWEL DISPOSABLE) ×3 IMPLANT
TRAY FOLEY BAG SILVER LF 14FR (SET/KITS/TRAYS/PACK) ×3 IMPLANT

## 2017-02-23 NOTE — H&P (Signed)
Ashley Pruitt is a 21 y.o. female, G1P0 at 37.4 weeks, presenting for ROM. Patient is under the care of [facility] and pregnancy history significant for resolved right choroid plexus, but otherwise unremarkable. Patient does desire an epidural and is anticipating a female infant.   Patient Active Problem List   Diagnosis Date Noted  . Indication for care or intervention in labor or delivery 02/23/2017    History of present pregnancy: Patient entered care at 6.3 weeks.   EDC of 03/11/2017 was established by 6.3wk US.   Anatomy scan:  19.3 weeks, with normal findings and an posterior placenta.   Additional US evaluations:   23.2wks Significant prenatal events: Choroid plexus cyts that resolved on 23.2wk ultrasound Last evaluation:  02/20/2017 in office by N.Dillard VE: 0/50/-3  OB History    Gravida Para Term Preterm AB Living   1             SAB TAB Ectopic Multiple Live Births                 No past medical history on file. No past surgical history on file. Family History: family history includes Diabetes in her maternal grandmother and paternal grandmother; Heart failure in her father and paternal grandfather; Hyperlipidemia in her maternal grandmother and mother; Hypertension in her father, maternal grandfather, maternal grandmother, mother, paternal grandfather, and paternal grandmother. Social History:  reports that  has never smoked. she has never used smokeless tobacco. She reports that she does not drink alcohol or use drugs.   Prenatal Transfer Tool  Maternal Diabetes: No Genetic Screening: Normal Maternal Ultrasounds/Referrals: Normal Fetal Ultrasounds or other Referrals:  None Maternal Substance Abuse:  No Significant Maternal Medications:  None Significant Maternal Lab Results: None    ROS:  +Ctx, +LoF, -VB,  No recent illness No GI or urination concerns  No Known Allergies     Blood pressure 120/69, pulse 90, temperature 98.2 F (36.8 C), resp. rate 18,  height 5\' 2"  (1.575 m), weight 94.8 kg (209 lb), last menstrual period 05/22/2016.  Physical Exam  Constitutional: She is oriented to person, place, and time. She appears well-developed and well-nourished. No distress.  HENT:  Head: Normocephalic and atraumatic.  Eyes: Conjunctivae are normal.  Neck: Normal range of motion.  Cardiovascular: Normal rate, regular rhythm and normal heart sounds.  Respiratory: Effort normal and breath sounds normal.  GI: Soft. Bowel sounds are normal.  Musculoskeletal: Normal range of motion.  Neurological: She is alert and oriented to person, place, and time.  Skin: Skin is warm and dry.  Psychiatric: She has a normal mood and affect. Her behavior is normal.    FHR: 125 bpm, Mod Var, -Decels, +Accels UCs:  Q2-406min  Prenatal labs: ABO, Rh: --/--/A POS (04/05 1729) Antibody: NEG (11/10 0400) Rubella:  Immune RPR:   NR HBsAg:   NR HIV:   NR GBS:  Negative Sickle cell/Hgb electrophoresis:  Normal Pap:  Unknown GC:  ND Chlamydia:  ND Other:  None    Assessment IUP at 37.5wks Cat I FT Active Labor Expectant Management GBS Negative  [facility]: [facility]  Routine Labor and Delivery Orders per [facility] Protocol In room to complete assessment and discuss POC: Expectant Mgmt Okay for epidural Dr.ND to be updated as appropriate  Cherre RobinsJessica L Brad Mcgaughy CNM, MSN 02/23/2017, 3:57 AM

## 2017-02-23 NOTE — Progress Notes (Signed)
Patient ID: Ashley LemonsMeasha Pruitt, female   DOB: 05-May-1995, 21 y.o.   MRN: [id number]  Pt is comfortable with the epidural.    BP (!) 122/55   Pulse (!) 120   Temp 98.1 F (36.7 C) (Oral)   Resp 16   Ht 5\' 2"  (1.575 m)   Wt 94.8 kg (209 lb)   LMP 05/22/2016   BMI 38.23 kg/m   Cat 2 tracing with late decelrations Pt had low blood pressure and was given phenylephrine still continues to have late declerations.  Pitocin stopped, pt position changed and given oxygen and she still had late decels.   Will proceed with CS.  R&B reviewed

## 2017-02-23 NOTE — Consult Note (Signed)
Neonatology Note:   Attendance at C-section:    I was asked by Dr. Dillard to attend this primary C/S at term due to fetal distress. The mother is a G1, GBS negative with good prenatal care. ROM 12 hours before delivery, fluid clear. Infant vigorous with good spontaneous cry and tone. Needed only minimal bulb suctioning. Ap 8/9. Lungs clear to ausc in DR. To CN to care of Pediatrician.  David C. Ehrmann, MD 

## 2017-02-23 NOTE — MAU Note (Signed)
SROM about 0230-clear flds. Contractions. Was here earlier for labor eval and was 1.5cm

## 2017-02-23 NOTE — Progress Notes (Addendum)
Patient ID: Ashley Pruitt, female   DOB: 05/25/1995, 21 y.o.   MRN: [id number] S: G1P0 874w5d here for SROM at 230am- moderate meconium stained fluid, Comfortable with epidural O: sve- 4/80/-2      FHT's 125 baseline- Category 1 : early decelerations with contractions      uc's- irregular:        Results for orders placed or performed during the hospital encounter of 02/23/17 (from the past 24 hour(s))  CBC     Status: Abnormal   Collection Time: 02/23/17  4:00 AM  Result Value Ref Range   WBC 9.5 4.0 - 10.5 K/uL   RBC 4.44 3.87 - 5.11 MIL/uL   Hemoglobin 10.0 (L) 12.0 - 15.0 g/dL   HCT 11.930.7 (L) 14.736.0 - 82.946.0 %   MCV 69.1 (L) 78.0 - 100.0 fL   MCH 22.5 (L) 26.0 - 34.0 pg   MCHC 32.6 30.0 - 36.0 g/dL   RDW 56.216.7 (H) 13.011.5 - 86.515.5 %   Platelets 231 150 - 400 K/uL  Type and screen [facility]     Status: None   Collection Time: 02/23/17  4:00 AM  Result Value Ref Range   ABO/RH(D) A POS    Antibody Screen NEG    Sample Expiration 02/26/2017   BP 136/79   Pulse 96   Temp 98.5 F (36.9 C) (Oral)   Resp 16   Ht 5\' 2"  (1.575 m)   Wt 209 lb (94.8 kg)   LMP 05/22/2016   BMI 38.23 kg/m   A: IUP @ 37+5     Primagravida     Spontaneous Labor     Meconium Stained fluid  P: IUPC placed     Amnioinfusion     Start Pitocin for adequate labor pattern     Anticipate Vaginal Delivery

## 2017-02-23 NOTE — Addendum Note (Signed)
Addendum  created 02/23/17 2051 by Wilder GladeWinn, Ayanni Tun G, CRNA   Charge Capture section accepted, Sign clinical note

## 2017-02-23 NOTE — Anesthesia Postprocedure Evaluation (Signed)
Anesthesia Post Note  Patient: Ashley Pruitt  Procedure(s) Performed: CESAREAN SECTION (N/A )     Patient location during evaluation: Mother Baby Anesthesia Type: Epidural Level of consciousness: awake, awake and alert and oriented Pain management: satisfactory to patient Vital Signs Assessment: post-procedure vital signs reviewed and stable Respiratory status: spontaneous breathing, nonlabored ventilation and respiratory function stable Cardiovascular status: blood pressure returned to baseline and stable Postop Assessment: no headache, no backache, epidural receding, patient able to bend at knees, no apparent nausea or vomiting and adequate PO intake Anesthetic complications: no    Last Vitals:  Vitals:   02/23/17 1815 02/23/17 1931  BP: 111/67 112/72  Pulse: 100 (!) 111  Resp: 16 16  Temp: 36.9 C 37 C  SpO2: 100% 97%    Last Pain:  Vitals:   02/23/17 1931  TempSrc: Oral  PainSc: 0-No pain   Pain Goal: Patients Stated Pain Goal: 2 (02/23/17 1815)               Mauricia AreaWINN,Alvira Hecht

## 2017-02-23 NOTE — Anesthesia Preprocedure Evaluation (Addendum)
Anesthesia Evaluation  Patient identified by MRN, date of birth, ID band Patient awake    Reviewed: Allergy & Precautions, NPO status , Patient's Chart, lab work & pertinent test results  Airway Mallampati: III  TM Distance: >3 FB Neck ROM: Full    Dental no notable dental hx. (+) Teeth Intact   Pulmonary neg pulmonary ROS,    Pulmonary exam normal breath sounds clear to auscultation       Cardiovascular negative cardio ROS Normal cardiovascular exam Rhythm:Regular Rate:Normal     Neuro/Psych negative neurological ROS  negative psych ROS   GI/Hepatic Neg liver ROS, GERD  ,  Endo/Other  Morbid obesity  Renal/GU negative Renal ROS  negative genitourinary   Musculoskeletal negative musculoskeletal ROS (+)   Abdominal (+) + obese,   Peds  Hematology negative hematology ROS (+)   Anesthesia Other Findings   Reproductive/Obstetrics (+) Pregnancy                            Anesthesia Physical Anesthesia Plan  ASA: III and emergent  Anesthesia Plan: Epidural   Post-op Pain Management:    Induction:   PONV Risk Score and Plan:   Airway Management Planned: Natural Airway  Additional Equipment:   Intra-op Plan:   Post-operative Plan:   Informed Consent: I have reviewed the patients History and Physical, chart, labs and discussed the procedure including the risks, benefits and alternatives for the proposed anesthesia with the patient or authorized representative who has indicated his/her understanding and acceptance.     Plan Discussed with: Anesthesiologist  Anesthesia Plan Comments: (Fetal intolerance to labor, for urgent C/section. Will use epidural for C/Section. M. Malen GauzeFoster, MD)       Anesthesia Quick Evaluation

## 2017-02-23 NOTE — Transfer of Care (Signed)
Immediate Anesthesia Transfer of Care Note  Patient: Ashley Pruitt  Procedure(s) Performed: CESAREAN SECTION (N/A )  Patient Location: PACU  Anesthesia Type:Epidural  Level of Consciousness: awake  Airway & Oxygen Therapy: Patient Spontanous Breathing  Post-op Assessment: Report given to RN and Post -op Vital signs reviewed and stable  Post vital signs: Reviewed  Last Vitals:  Vitals:   02/23/17 1315 02/23/17 1321  BP: (!) 122/55 115/63  Pulse: (!) 120 (!) 136  Resp:    Temp:      Last Pain:  Vitals:   02/23/17 1030  TempSrc:   PainSc: 0-No pain      Patients Stated Pain Goal: 4 (02/23/17 0340)  Complications: none

## 2017-02-23 NOTE — Progress Notes (Signed)
80 mcg phenylephrine administered, Dr Normand Sloopillard and L Clemmons at bedside. .Marland Kitchen

## 2017-02-23 NOTE — Lactation Note (Signed)
This note was copied from a baby's chart. Lactation Consultation Note  Patient Name: Ashley Pruitt Reason for consult: Initial assessment;Early term 37-38.6wks;Primapara   Initial consult at 376 hrs old; Mom is P1.   GA 37.5; BW 7 lbs, 1.8 oz; C-section decels; apgars 8 & 9, light meconium Infant has breastfed x1 (10 min); voids-0; stools-0; LS-8. Mom stated she does not want to breastfeed but only wants to formula feed.  Mom stated she did not want to provide breastmilk for baby; only wanted to formula feed.   Mom had just given 10 ml formula to infant and was concerned that he did not want more. LC educated on size of infant's stomach and stomach capacity and risks of overfeeding. Educated on risks of giving formula r/t milk production, allergies, decreased milk supply, etc.  Discussed drying up mom's milk. Educated on paced bottle feeding method and rationale. Gave day of life guideline sheet for amounts to feed and gave formula preparation sheet. [facility]ed mom [facility].  Mom independently return demonstrated safe burping.   Reviewed the need to feed formula fed baby every 3-4 hrs based on cues and to watch for cues to know when infant has had enough.   Encouraged mom to call for assistance as needed with feedings.    Consult Status Consult Status: Complete    Lendon KaVann, Ilyanna Baillargeon Walker Pruitt, 8:26 PM

## 2017-02-23 NOTE — Plan of Care (Signed)
Progressing appropriately.

## 2017-02-23 NOTE — Anesthesia Procedure Notes (Signed)
Epidural Patient location during procedure: OB Start time: 02/23/2017 7:17 AM  Staffing Anesthesiologist: Mal AmabileFoster, Sonjia Wilcoxson, MD Performed: anesthesiologist   Preanesthetic Checklist Completed: patient identified, site marked, surgical consent, pre-op evaluation, timeout performed, IV checked, risks and benefits discussed and monitors and equipment checked  Epidural Patient position: sitting Prep: site prepped and draped and DuraPrep Patient monitoring: continuous pulse ox and blood pressure Approach: midline Location: L3-L4 Injection technique: LOR air  Needle:  Needle type: Tuohy  Needle gauge: 17 G Needle length: 9 cm and 9 Needle insertion depth: 6 cm Catheter type: closed end flexible Catheter size: 19 Gauge Catheter at skin depth: 11 cm Test dose: negative and Other  Assessment Events: blood not aspirated, injection not painful, no injection resistance, negative IV test and no paresthesia  Additional Notes Patient identified. Risks and benefits discussed including failed block, incomplete  Pain control, post dural puncture headache, nerve damage, paralysis, blood pressure Changes, nausea, vomiting, reactions to medications-both toxic and allergic and post Partum back pain. All questions were answered. Patient expressed understanding and wished to proceed. Sterile technique was used throughout procedure. Epidural site was Dressed with sterile barrier dressing. No paresthesias, signs of intravascular injection Or signs of intrathecal spread were encountered.  Patient was more comfortable after the epidural was dosed. Please see RN's note for documentation of vital signs and FHR which are stable.

## 2017-02-23 NOTE — Anesthesia Postprocedure Evaluation (Signed)
Anesthesia Post Note  Patient: Ashley Pruitt  Procedure(s) Performed: CESAREAN SECTION (N/A )     Patient location during evaluation: PACU Level of consciousness: oriented and awake and alert Pain management: pain level controlled Vital Signs Assessment: post-procedure vital signs reviewed and stable Respiratory status: spontaneous breathing, respiratory function stable and nonlabored ventilation Cardiovascular status: blood pressure returned to baseline and stable Postop Assessment: no headache, no backache, no apparent nausea or vomiting, epidural receding and patient able to bend at knees Anesthetic complications: no    Last Vitals:  Vitals:   02/23/17 1545 02/23/17 1600  BP: 119/63 121/65  Pulse: (!) 117 (!) 117  Resp: (!) 29 18  Temp:  37.1 C  SpO2: 100% 100%    Last Pain:  Vitals:   02/23/17 1600  TempSrc: Axillary  PainSc: 0-No pain   Pain Goal: Patients Stated Pain Goal: 4 (02/23/17 0340)               Daylen Hack A.

## 2017-02-23 NOTE — Op Note (Signed)
Cesarean Section Procedure Note   [name]  02/23/2017  Indications: Fetal Intolerance of labor  Pre-operative Diagnosis: Primary Cesarean Section due to fetal intolerance of labor.   Post-operative Diagnosis: Same   Surgeon: Surgeon(s) and Role:    * Jaymes Graffillard, Shellyann Wandrey, MD - Primary   Assistants: surgical assistant   Anesthesia: epidural   Procedure Details:  The patient was seen in the Holding Room. The risks, benefits, complications, treatment options, and expected outcomes were discussed with the patient. The patient concurred with the proposed plan, giving informed consent. identified as Ashley Pruitt and the procedure verified as C-Section Delivery. A Time Out was held and the above information confirmed.  After induction of anesthesia, the patient was draped and prepped in the usual sterile manner. A transverse incision was made and carried down through the subcutaneous tissue to the fascia. Fascial incision was made in the midline and extended transversely. The fascia was separated from the underlying rectus muscle superiorly and inferiorly. The peritoneum was identified and entered. Peritoneal incision was extended longitudinally with good visualization of bowel and bladder. The utero-vesical peritoneal reflection was incised transversely and the bladder flap was bluntly freed from the lower uterine segment.  An alexsis retractor was placed in the abdomen.   A low transverse uterine incision was made. Delivered from cephalic presentation was a  infant, with Apgar scores of 8 at one minute and 9 at five minutes. Cord ph was sent the umbilical cord was clamped and cut cord blood was obtained for evaluation. The placenta was removed Intact and appeared normal. The uterine outline, tubes and ovaries appeared normal}. The uterine incision was closed with running locked sutures of 0Vicryl. A second layer 0 vicrlyl was used to imbricate the uterine incision    Hemostasis was observed.  Lavage was carried out until clear. The alexsis was removed.  The peritoneum was closed with 0 chromic.  The muscles were examined and any bleeders were made hemostatic using bovie cautery device.   The fascia was then reapproximated with running sutures of 0 vicryl.  The subcutaneous tissue was reapproximated  With interrupted stitches using 2-0 plain gut. The subcuticular closure was performed using 3-40monocryl     Instrument, sponge, and needle counts were correct prior the abdominal closure and were correct at the conclusion of the case.    Findings: infant was delivered from vtx presentation. The fluid was meconium.  The uterus tubes and ovaries appeared normal.     Estimated Blood Loss: 661ml   Total IV Fluids: 2100ml   Urine Output: 1200mlCC OF clear urine  Specimens: placenta to path  Complications: no complications  Disposition: PACU - hemodynamically stable.   Maternal Condition: stable   Baby condition / location:  Couplet care / Skin to Skin  Attending Attestation:I performed the procedure   Signed: Surgeon(s): Jaymes Graffillard, Audrey Eller, MD

## 2017-02-24 LAB — CBC
HCT: 27.3 % — ABNORMAL LOW (ref 36.0–46.0)
HEMOGLOBIN: 8.8 g/dL — AB (ref 12.0–15.0)
MCH: 22.9 pg — ABNORMAL LOW (ref 26.0–34.0)
MCHC: 32.2 g/dL (ref 30.0–36.0)
MCV: 70.9 fL — AB (ref 78.0–100.0)
PLATELETS: 233 10*3/uL (ref 150–400)
RBC: 3.85 MIL/uL — AB (ref 3.87–5.11)
RDW: 17.1 % — ABNORMAL HIGH (ref 11.5–15.5)
WBC: 14.3 10*3/uL — AB (ref 4.0–10.5)

## 2017-02-24 MED ORDER — FERROUS SULFATE 325 (65 FE) MG PO TABS
325.0000 mg | ORAL_TABLET | Freq: Two times a day (BID) | ORAL | Status: DC
  Administered 2017-02-24 – 2017-02-26 (×5): 325 mg via ORAL
  Filled 2017-02-24 (×5): qty 1

## 2017-02-24 NOTE — Progress Notes (Signed)
Ashley Pruitt [id number] Postpartum Day 1 S/P Primary Cesarean Section due to Fetal Intolerance to Labor  Subjective: Patient up ad lib, denies syncope or dizziness. Reports consuming regular diet without issues and denies N/V. Patient reports no bowel movement but is passing flatus.  Denies issues with urination and reports bleeding is "like a period."  Patient is bottle feeding and reports going well.  Unsure of method for postpartum contraception.  Pain is being appropriately managed with use of motrin and tylenol.   Objective: Temp:  [97.7 F (36.5 C)-99 F (37.2 C)] 98.4 F (36.9 C) (11/11 0327) Pulse Rate:  [92-136] 110 (11/11 0327) Resp:  [16-29] 18 (11/11 0327) BP: (76-154)/(41-98) 95/68 (11/11 0327) SpO2:  [95 %-100 %] 98 % (11/11 0523)  Recent Labs    02/23/17 0400 02/24/17 0501  HGB 10.0* 8.8*  HCT 30.7* 27.3*  WBC 9.5 14.3*    Physical Exam:  General: alert, cooperative and no distress Mood/Affect: Appropriate/Bright Lungs: clear to auscultation, no wheezes, rales or rhonchi, symmetric air entry.  Heart: normal rate and regular rhythm. Breast: not examined. Abdomen:  + bowel sounds, Soft, Appropriately Tender Incision: Pressure Dressing removed, Honeycomb dressing with some drainage Uterine Fundus: firm at umbilicus Lochia: appropriate Skin: Warm, Dry. DVT Evaluation: No evidence of DVT seen on physical exam. JP drain:   None  Assessment Post Operative Day 1 S/P Primary C/S Normal Involution Bottle Feeding Hemodynamically Stable Female Child  Plan: -Will look into insurance for circumcision -Okay to shower today -Replace honeycomb dressing after shower -Ambulate in halls -Fe+ Supplementation -Continue other mgmt as ordered -Dr. ND to be updated on patient status  Ashley RobinsJessica L Urbano Milhouse MSN, CNM 02/24/2017, 6:13 AM

## 2017-02-24 NOTE — Anesthesia Postprocedure Evaluation (Signed)
Anesthesia Post Note  Patient: Ashley Pruitt  Procedure(s) Performed: CESAREAN SECTION (N/A )     Patient location during evaluation: Mother Baby Anesthesia Type: Epidural Level of consciousness: awake and alert Pain management: pain level controlled Vital Signs Assessment: post-procedure vital signs reviewed and stable Respiratory status: spontaneous breathing, nonlabored ventilation and respiratory function stable Cardiovascular status: stable Postop Assessment: no headache, no backache, epidural receding and patient able to bend at knees Anesthetic complications: no    Last Vitals:  Vitals:   02/24/17 0327 02/24/17 0523  BP: 95/68   Pulse: (!) 110   Resp: 18   Temp: 36.9 C   SpO2: 95% 98%    Last Pain:  Vitals:   02/24/17 0523  TempSrc:   PainSc: 0-No pain   Pain Goal: Patients Stated Pain Goal: 2 (02/23/17 1815)               Rica RecordsICKELTON,Aundra Espin

## 2017-02-24 NOTE — Addendum Note (Signed)
Addendum  created 02/24/17 0806 by Rica Recordsickelton, Stephie Xu, CRNA   Charge Capture section accepted

## 2017-02-24 NOTE — Plan of Care (Signed)
Progressing appropriately.

## 2017-02-24 NOTE — Addendum Note (Signed)
Addendum  created 02/24/17 0758 by Rica Recordsickelton, Kleber Crean, CRNA   Charge Capture section accepted, Sign clinical note

## 2017-02-25 MED ORDER — OXYCODONE-ACETAMINOPHEN 5-325 MG PO TABS: 2.0000 | ORAL_TABLET | ORAL | Status: DC | PRN

## 2017-02-25 MED ORDER — OXYCODONE-ACETAMINOPHEN 5-325 MG PO TABS
2.0000 | ORAL_TABLET | ORAL | Status: DC | PRN
  Filled 2017-02-25: qty 2

## 2017-02-25 MED ORDER — OXYCODONE-ACETAMINOPHEN 5-325 MG PO TABS
1.0000 | ORAL_TABLET | ORAL | Status: DC | PRN
  Administered 2017-02-25: 1 via ORAL
  Filled 2017-02-25: qty 1

## 2017-02-25 NOTE — Progress Notes (Signed)
CSW received consult due to score 9 on Edinburgh Depression Screen.    CSW is screening out referral since since a 9 on EDPS does not warrant a CSW consult.   Please contact CSW if MOB request.   Blaine HamperAngel Boyd-Gilyard, MSW, LCSW Clinical Social Work [phone]

## 2017-02-25 NOTE — Progress Notes (Signed)
Ashley Pruitt [id number] Postpartum Day 2 S/P Primary Cesarean Section due to Fetal Intolerance to Labor  Subjective: Patient up ad lib, denies syncope or dizziness. Reports consuming regular diet without issues and denies N/V. Patient reports bowel movement without issues and continues to pass flatus.  Denies issues with urination and reports bleeding has "stopped."  Patient is bottle feeding and reports going well.  Unsure of method for postpartum contraception.  Pain is being appropriately managed with use of motrin and tylenol.   Objective: Temp:  [98 F (36.7 C)-98.7 F (37.1 C)] 98 F (36.7 C) (11/12 0536) Pulse Rate:  [90-104] 90 (11/12 0536) Resp:  [18-20] 18 (11/12 0536) BP: (93-128)/(36-76) 93/44 (11/12 0536) SpO2:  [96 %-99 %] 99 % (11/11 1432)  Recent Labs    02/23/17 0400 02/24/17 0501  HGB 10.0* 8.8*  HCT 30.7* 27.3*  WBC 9.5 14.3*    Physical Exam:  General: alert, cooperative and no distress Mood/Affect: Appropriate/Bright Lungs: clear to auscultation, no wheezes, rales or rhonchi, symmetric air entry.  Heart: normal rate and regular rhythm. Breast: not examined. Abdomen:  + bowel sounds, Soft, Appropriately Tender Incision: Pressure Dressing removed, Honeycomb dressing with some drainage Uterine Fundus: firm at umbilicus Lochia: appropriate Skin: Warm, Dry. DVT Evaluation: No evidence of DVT seen on physical exam. JP drain:   None  Assessment Post Operative Day 1 S/P Primary C/S Normal Involution Bottle Feeding Hemodynamically Stable Female Child  Plan: -Desires circumcision -Replace honeycomb dressing -Plan for discharge tomorrow -Continue other mgmt as ordered -Dr. Lynford HumphreySR to be updated on patient status  Ashley RobinsJessica L Olimpia Tinch MSN, CNM 02/25/2017, 6:25 AM

## 2017-02-26 ENCOUNTER — Encounter (HOSPITAL_COMMUNITY): Payer: Self-pay | Admitting: *Deleted

## 2017-02-26 DIAGNOSIS — Z98891 History of uterine scar from previous surgery: Secondary | ICD-10-CM

## 2017-02-26 DIAGNOSIS — D62 Acute posthemorrhagic anemia: Secondary | ICD-10-CM | POA: Diagnosis not present

## 2017-02-26 MED ORDER — FERROUS SULFATE 325 (65 FE) MG PO TABS: 325.0000 mg | ORAL_TABLET | Freq: Every day | ORAL | 3 refills | Status: DC

## 2017-02-26 MED ORDER — OXYCODONE-ACETAMINOPHEN 5-325 MG PO TABS: 1.0000 | ORAL_TABLET | ORAL | 0 refills | Status: DC | PRN

## 2017-02-26 MED ORDER — IBUPROFEN 600 MG PO TABS: 600.0000 mg | ORAL_TABLET | Freq: Four times a day (QID) | ORAL | 2 refills | Status: DC | PRN

## 2017-02-26 MED ORDER — POLYSACCHARIDE IRON COMPLEX 150 MG PO CAPS: 150.0000 mg | ORAL_CAPSULE | Freq: Every day | ORAL | 1 refills | Status: DC

## 2017-02-26 NOTE — Discharge Instructions (Signed)
Iron Deficiency Anemia, Adult °Iron-deficiency anemia is when you have a low amount of red blood cells or hemoglobin. This happens because you have too little iron in your body. Hemoglobin carries oxygen to parts of the body. Anemia can cause your body to not get enough oxygen. It may or may not cause symptoms. °Follow these instructions at home: °Medicines °· Take over-the-counter and prescription medicines only as told by your doctor. This includes iron pills (supplements) and vitamins. °· If you cannot handle taking iron pills by mouth, ask your doctor about getting iron through: °? A vein (intravenously). °? A shot (injection) into a muscle. °· Take iron pills when your stomach is empty. If you cannot handle this, take them with food. °· Do not drink milk or take antacids at the same time as your iron pills. °· To prevent trouble pooping (constipation), eat fiber or take medicine (stool softener) as told by your doctor. °Eating and drinking °· Talk with your doctor before changing the foods you eat. He or she may tell you to eat foods that have a lot of iron, such as: °? Liver. °? Lowfat (lean) beef. °? Breads and cereals that have iron added to them (fortified breads and cereals). °? Eggs. °? Dried fruit. °? Dark green, leafy vegetables. °· Drink enough fluid to keep your pee (urine) clear or pale yellow. °· Eat fresh fruits and vegetables that are high in vitamin C. They help your body to use iron. Foods with a lot of vitamin C include: °? Oranges. °? Peppers. °? Tomatoes. °? Mangoes. °General instructions °· Return to your normal activities as told by your doctor. Ask your doctor what activities are safe for you. °· Keep yourself clean, and keep things clean around you (your surroundings). Anemia can make you get sick more easily. °· Keep all follow-up visits as told by your doctor. This is important. °Contact a doctor if: °· You feel sick to your stomach (nauseous). °· You throw up (vomit). °· You feel  weak. °· You are sweating for no clear reason. °· You have trouble pooping, such as: °? Pooping (having a bowel movement) less than 3 times a week. °? Straining to poop. °? Having poop that is hard, dry, or larger than normal. °? Feeling full or bloated. °? Pain in the lower belly. °? Not feeling better after pooping. °Get help right away if: °· You pass out (faint). If this happens, do not drive yourself to the hospital. Call your local emergency services (911 in the [place] °· You have chest pain. °· You have shortness of breath that: °? Is very bad. °? Gets worse with physical activity. °· You have a fast heartbeat. °· You get light-headed when getting up from sitting or lying down. °This information is not intended to replace advice given to you by your health care provider. Make sure you discuss any questions you have with your health care provider. °Document Released: 05/05/2010 Document Revised: 12/21/2015 Document Reviewed: 12/21/2015 °Elsevier Interactive Patient Education © 2017 [employer] °Postpartum Care After Cesarean Delivery °The period of time right after you deliver your newborn is called the postpartum period. °What kind of medical care will I receive? °· You may continue to receive fluids and medicines through an IV tube inserted into one of your veins. °· You may have small, flexible tube (catheter) draining urine from your bladder into a bag outside of your body. The catheter will be removed as soon as possible. °· You may be   given a squirt bottle to use when you go to the bathroom. You may use this until you are comfortable wiping as usual. To use the squirt bottle, follow these steps: °? Before you urinate, fill the squirt bottle with warm water. The water should be warm. Do not use hot water. °? After you urinate, while you are sitting on the toilet, use the squirt bottle to rinse the area around your urethra and vaginal opening. This rinses away any urine and blood. °? You may do this  instead of wiping. As you start healing, you may use the squirt bottle before wiping yourself. Make sure to wipe gently. °? Fill the squirt bottle with clean water every time you use the bathroom. °· You will be given sanitary pads to wear. °· Your incision will be monitored to make sure it is healing properly. You will be told when it is safe for your stitches, staples, or skin adhesive tape to be removed. °What can I expect? °· You may not feel the need to urinate for several hours after delivery. °· You will have some soreness and pain in your abdomen. You may have a small amount of blood or clear fluid coming from your incision. °· If you are breastfeeding, you may have uterine contractions every time you breastfeed for up to several weeks postpartum. Uterine contractions help your uterus return to its normal size. °· It is normal to have vaginal bleeding (lochia) after delivery. The amount and appearance of lochia is often similar to a menstrual period in the first week after delivery. It will gradually decrease over the next few weeks to a dry, yellow-brown discharge. For most women, lochia stops completely by 6-8 weeks after delivery. Vaginal bleeding can vary from woman to woman. °· Within the first few days after delivery, you may have breast engorgement. This is when your breasts feel heavy, full, and uncomfortable. Your breasts may also throb and feel hard, tightly stretched, warm, and tender. After this occurs, you may have milk leaking from your breasts. Your health care provider can help you relieve discomfort due to breast engorgement. Breast engorgement should go away within a few days. °· You may feel more sad or worried than normal due to hormonal changes after delivery. These feelings should not last more than a few days. If these feelings do not go away after several days, speak with your health care provider. °How should I care for myself? °· Tell your health care provider if you have pain or  discomfort. °· Drink enough water to keep your urine clear or pale yellow. °· Wash your hands thoroughly with soap and water for at least 20 seconds after changing your sanitary pads or using the toilet, and before holding or feeding your baby. °· If you are not breastfeeding, avoid touching your breasts a lot. Doing this can make your breasts produce more milk. °· If you become weak or lightheaded, or you feel like you might faint, ask for help before: °? Getting out of bed. °? Showering. °· Change your sanitary pads frequently. Watch for any changes in your flow, such as a sudden increase in volume, a change in color, or the passing of large blood clots. If you pass a blood clot from your vagina, save it to show to your health care provider. Do not flush blood clots down the toilet without having your health care provider look at them. °· Make sure that all your vaccinations are up to date. This can help   protect you and your baby from getting certain diseases. You may need to have immunizations done before you leave the hospital. °· If desired, talk with your health care provider about methods of family planning or birth control (contraception). °How can I start bonding with my baby? °Spending as much time as possible with your baby is very important. During this time, you and your baby can get to know each other and develop a bond. Having your baby stay with you in your room (rooming in) can give you time to get to know your baby. Rooming in can also help you become comfortable caring for your baby. Breastfeeding can also help you bond with your baby. °How can I plan for returning home with my baby? °· Make sure that you have a car seat installed in your vehicle. °? Your car seat should be checked by a certified car seat installer to make sure that it is installed safely. °? Make sure that your baby fits into the car seat safely. °· Ask your health care provider any questions you have about caring for yourself or  your baby. Make sure that you are able to contact your health care provider with any questions after leaving the hospital. °This information is not intended to replace advice given to you by your health care provider. Make sure you discuss any questions you have with your health care provider. °Document Released: 12/26/2011 Document Revised: 09/05/2015 Document Reviewed: 03/07/2015 °Elsevier Interactive Patient Education © 2018 [employer]. ° °

## 2017-02-26 NOTE — Plan of Care (Signed)
Tolerating activity well. Pain controlled with ordered pain medication. Bonding well with newborn.

## 2017-02-26 NOTE — Discharge Summary (Signed)
OB Discharge Summary     Patient Name: Ashley LemonsMeasha Pruitt DOB: [birth year] MRN: [id number]  Date of admission: 02/23/2017 Delivering MD: Jaymes GraffILLARD, NAIMA   Date of discharge: 02/26/2017  Admitting diagnosis: 37 WKS WATER BROKE Intrauterine pregnancy: 4748w1d     Secondary diagnosis:  Active Problems:   Indication for care or intervention in labor or delivery   Status post primary low transverse cesarean section--NRFHR   Acute blood loss anemia  Additional problems: NA     Discharge diagnosis: Term Pregnancy Delivered, Anemia and [facility]                                                                                                Post partum procedures:NA  Augmentation: Pitocin  Complications: None  Hospital course:  Onset of Labor With Unplanned C/S  21 y.o. yo G1P0 at 1448w1d was admitted in Latent Labor on 02/23/2017. Patient had a labor course significant for NRFHR, MSF. Membrane Rupture Time/Date: 2:30 AM ,02/23/2017   The patient went for cesarean section due to Non-Reassuring FHR, and delivered a Viable infant,02/23/2017  Details of operation can be found in separate operative note. Patient had an uncomplicated postpartum course.  She is ambulating,tolerating a regular diet, passing flatus, and urinating well.  Patient is discharged home in stable condition 02/26/17.  Birth control decision: Baby was circumcised on day 3.  Physical exam  Vitals:   02/24/17 1900 02/25/17 0536 02/25/17 1734 02/26/17 0614  BP: (!) 96/36 (!) 93/44 (!) 108/52 (!) 90/50  Pulse: 95 90 97 92  Resp: 20 18 18 18   Temp: 98.3 F (36.8 C) 98 F (36.7 C) 98.2 F (36.8 C) 98.2 F (36.8 C)  TempSrc: Oral Oral Oral Oral  SpO2:    99%  Weight:      Height:       General: alert Lochia: appropriate Uterine Fundus: firm Incision: Dressing is clean, dry, and intact DVT Evaluation: No evidence of DVT seen on physical exam. Negative Homan's sign. Labs: CBC Latest Ref  Rng & Units 02/24/2017 02/23/2017 08/03/2016  WBC 4.0 - 10.5 K/uL 14.3(H) 9.5 9.6  Hemoglobin 12.0 - 15.0 g/dL 4.0(J8.8(L) 10.0(L) 12.3  Hematocrit 36.0 - 46.0 % 27.3(L) 30.7(L) 36.7  Platelets 150 - 400 K/uL 233 231 320   CMP Latest Ref Rng & Units 08/03/2016  Glucose 65 - 99 mg/dL 96  BUN 6 - 20 mg/dL <8(J<5(L)  Creatinine 1.910.44 - 1.00 mg/dL 4.780.50  Sodium 295135 - 621145 mmol/L 133(L)  Potassium 3.5 - 5.1 mmol/L 3.6  Chloride 101 - 111 mmol/L 104  CO2 22 - 32 mmol/L 21(L)  Calcium 8.9 - 10.3 mg/dL 9.2  Total Protein 6.5 - 8.1 g/dL 7.1  Total Bilirubin 0.3 - 1.2 mg/dL 0.4  Alkaline Phos 38 - 126 U/L 54  AST 15 - 41 U/L 17  ALT 14 - 54 U/L 10(L)    Discharge instruction: per After Visit Summary and "Baby and Me Booklet".  After visit meds:  Allergies as of 02/26/2017   No Known Allergies     Medication List  TAKE these medications   acetaminophen 500 MG tablet Commonly known as:  TYLENOL Take 1,000 mg every 6 (six) hours as needed by mouth for mild pain.   ibuprofen 600 MG tablet Commonly known as:  ADVIL,MOTRIN Take 1 tablet (600 mg total) every 6 (six) hours as needed by mouth.   iron polysaccharides 150 MG capsule Commonly known as:  NIFEREX Take 1 capsule (150 mg total) daily by mouth. What changed:  when to take this   oxyCODONE-acetaminophen 5-325 MG tablet Commonly known as:  PERCOCET/ROXICET Take 1 tablet every 4 (four) hours as needed by mouth for moderate pain (pain 4-7).   prenatal multivitamin Tabs tablet Take 1 tablet by mouth daily after breakfast.       Diet: routine diet  Activity: Advance as tolerated. Pelvic rest for 6 weeks.   Outpatient follow up:6 weeks Follow up Appt:No future appointments. Follow up Visit:No Follow-up on file.  Postpartum contraception: Undecided at present. Options reviewed.  Newborn Data: Live born female  Birth Weight: 7 lb 1.8 oz (3225 g) APGAR: 8, 9  Newborn Delivery   Birth date/time:  02/23/2017 14:01:00 Delivery  type:  C-Section, Low Transverse C-section categorization:  Primary     Baby Feeding: Bottle Disposition:home with mother   02/26/2017 Nigel BridgemanLATHAM, Engelbert Sevin, CNM

## 2018-07-12 IMAGING — CT CT HEAD W/O CM
3 series · 14 of 47 positions shown, 16 images · non-contrast
Comparison: None.

CLINICAL DATA: Frontal headache for 1 week.  No reported injury.

EXAM:
CT HEAD WITHOUT CONTRAST
TECHNIQUE: Contiguous axial images were obtained from the base of the skull
through the vertex without intravenous contrast.

[Series 2: head 5.0 h30s · axial · 0.40mm/px · z∈[-125,+0]mm · 8 of 31 slices shown, 10 images]
[im 3/31  brain]
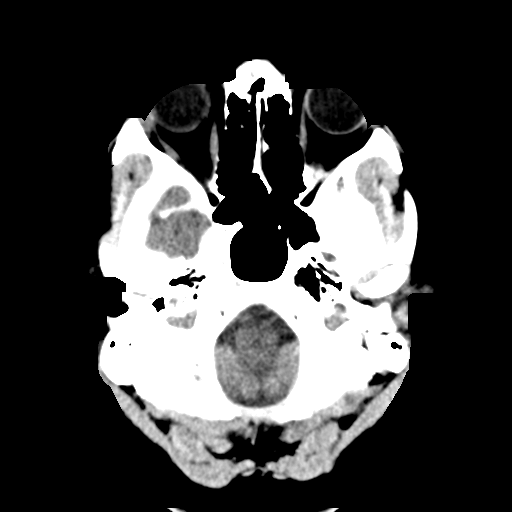
[im 3/31  bone]
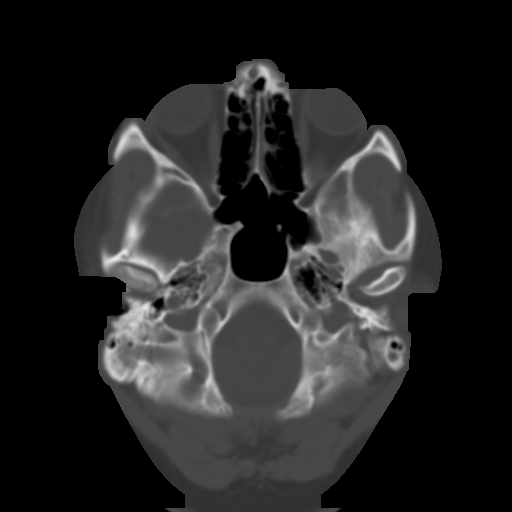
[im 7/31  brain]
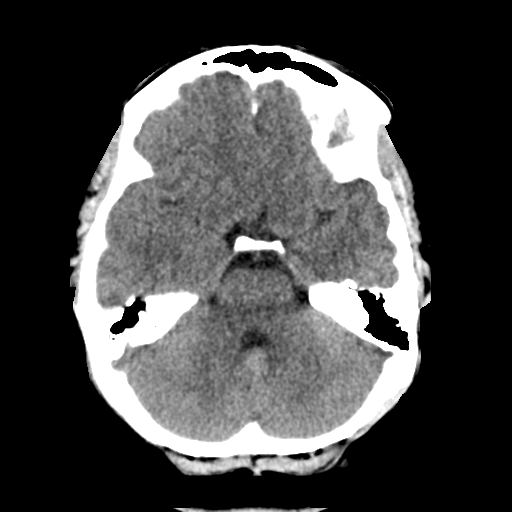
[im 10/31  brain]
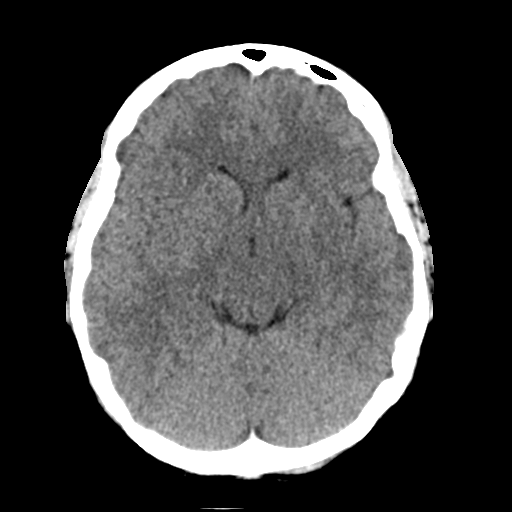
[im 14/31  brain]
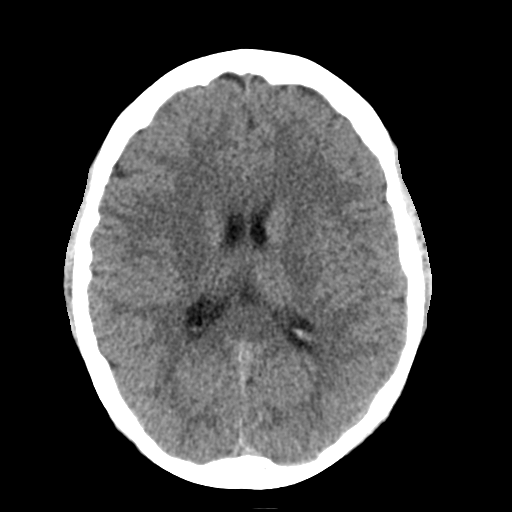
[im 17/31  brain]
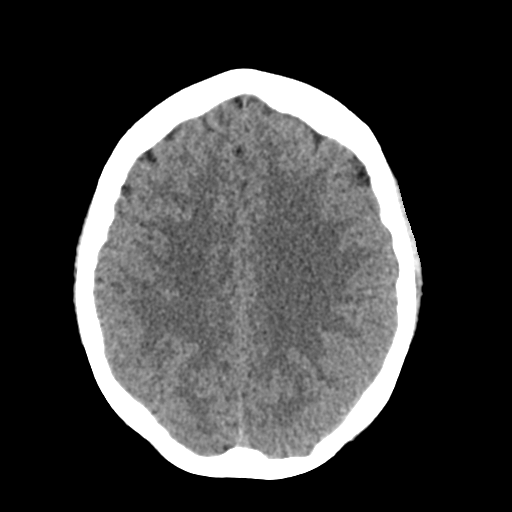
[im 17/31  bone]
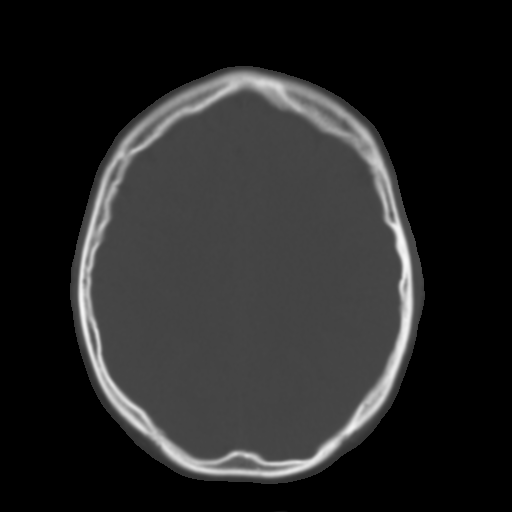
[im 21/31  brain]
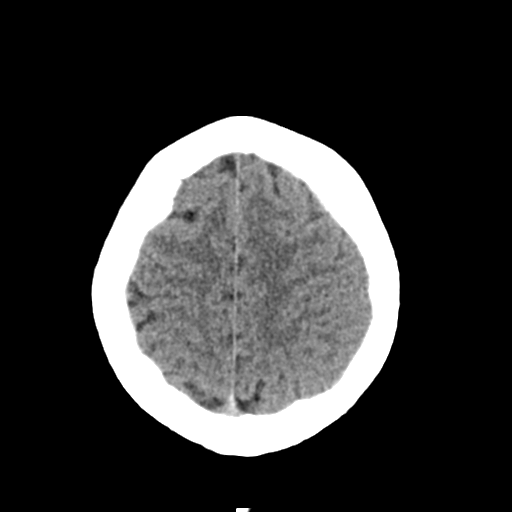
[im 24/31  brain]
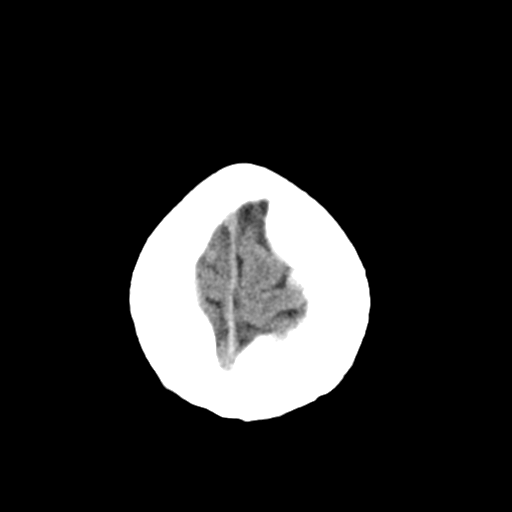
[im 28/31  brain]
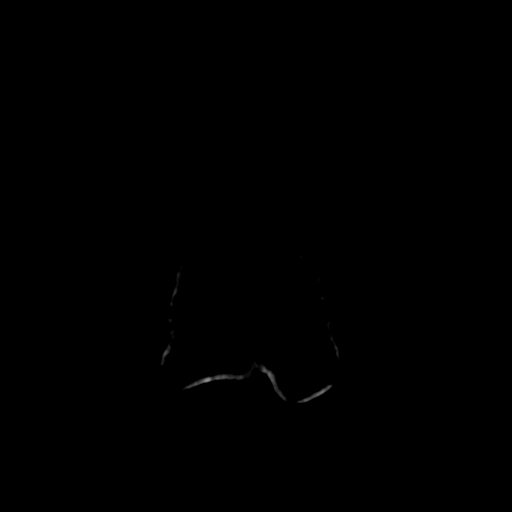

[Series 4: head 3.0 mpr cor · coronal · 0.28mm/px · 3 of 67 slices shown]
[im 23/67  brain]
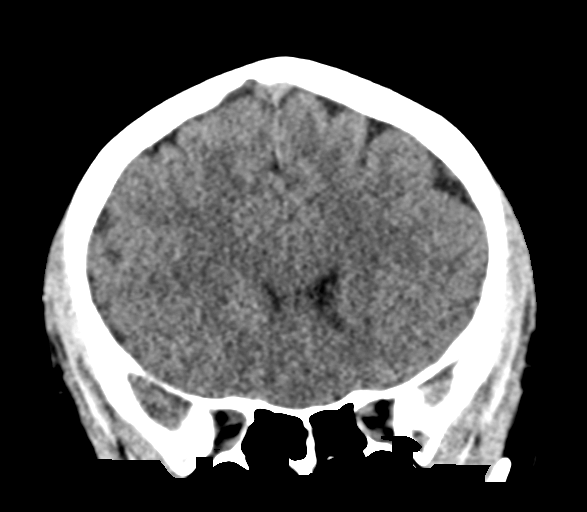
[im 30/67  brain]
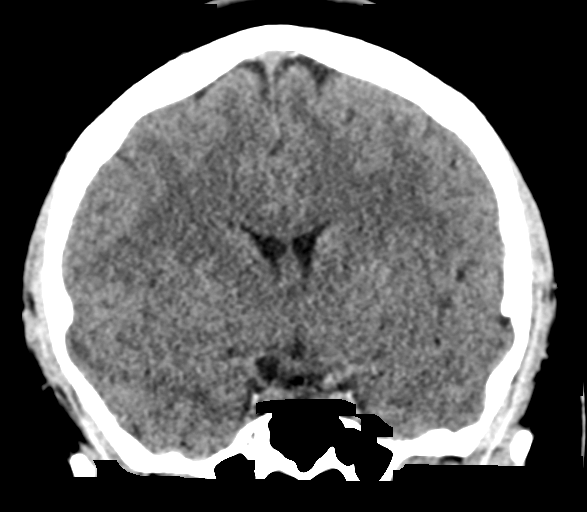
[im 37/67  brain]
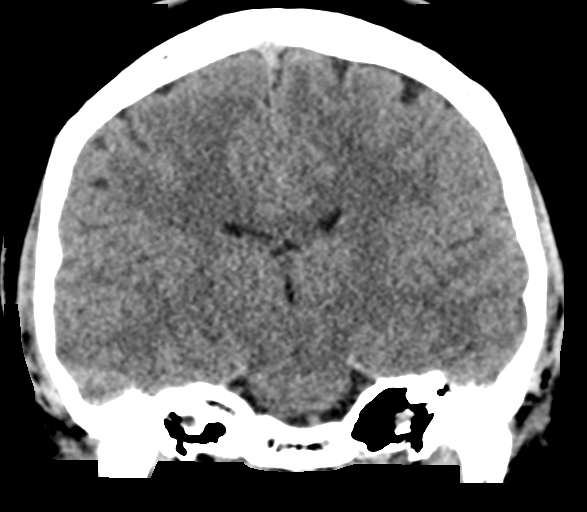

[Series 5: head 3.0 mpr sag · sagittal · 0.26mm/px · 3 of 53 slices shown]
[im 18/53  brain]
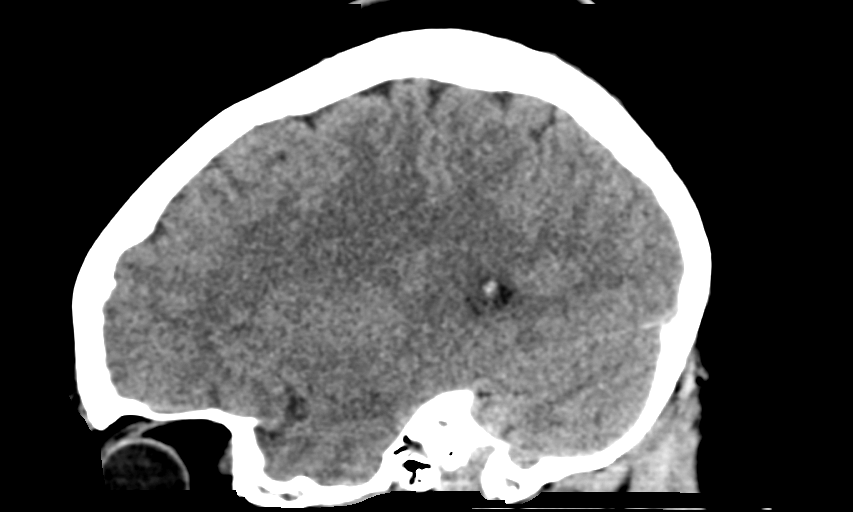
[im 27/53  brain]
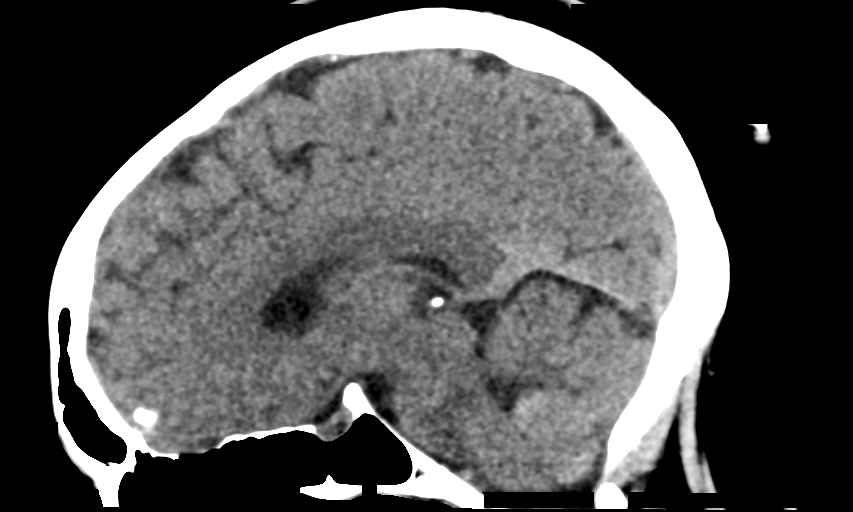
[im 35/53  brain]
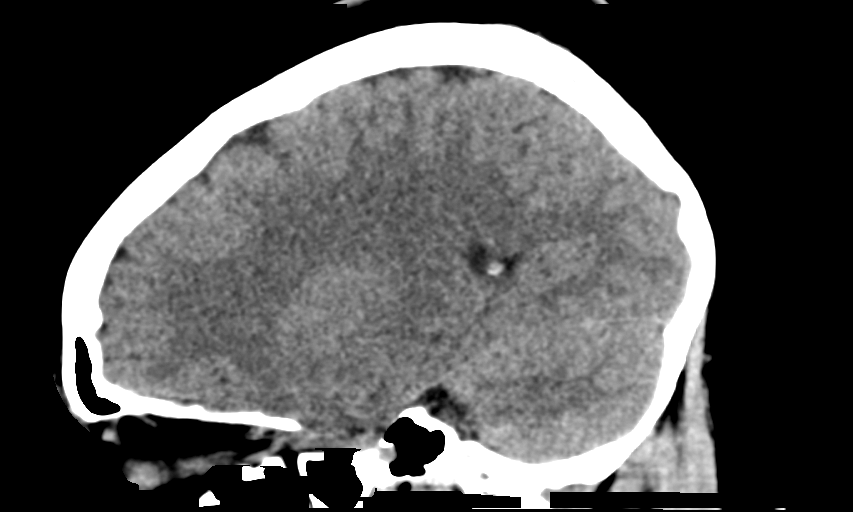

[14 of 47 positions shown; findings below may reference images not displayed]

FINDINGS: Brain: No evidence of parenchymal hemorrhage or extra-axial fluid
collection. No mass lesion, mass effect, or midline shift. No CT
evidence of acute infarction. Cerebral volume is age appropriate. No
ventriculomegaly.

Vascular: No hyperdense vessel or unexpected calcification.

Skull: No evidence of calvarial fracture.

Sinuses/Orbits: The visualized paranasal sinuses are essentially
clear.

Other:  The mastoid air cells are unopacified.
IMPRESSION: Negative head CT.  No evidence of acute intracranial abnormality.

## 2018-12-17 ENCOUNTER — Other Ambulatory Visit: Payer: Self-pay

## 2018-12-17 ENCOUNTER — Ambulatory Visit (HOSPITAL_COMMUNITY)
Admission: EM | Admit: 2018-12-17 | Discharge: 2018-12-17 | Disposition: A | Discharge status: Home / Self Care | Payer: 59 | Attending: Family Medicine | Admitting: Family Medicine

## 2018-12-17 ENCOUNTER — Encounter (HOSPITAL_COMMUNITY): Payer: Self-pay

## 2018-12-17 DIAGNOSIS — N766 Ulceration of vulva: Secondary | ICD-10-CM | POA: Diagnosis not present

## 2018-12-17 MED ORDER — VALACYCLOVIR HCL 1 G PO TABS: 1000.0000 mg | ORAL_TABLET | Freq: Two times a day (BID) | ORAL | 0 refills | Status: AC

## 2018-12-17 NOTE — Discharge Instructions (Signed)
I am concerned about herpes with what I am seeing today, therefore we will start medication to treat this.  Culture is pending, will take a few days to come back. This test can have some false negatives, but can be fairly accurate. Will notify of any positive findings and if any changes to treatment are needed.  You may monitor your results on your MyChart online as well.   I would avoid intercourse or further shaving until this has healed.  Please return to be seen or follow up with your PCP for any persistent symptoms or concerned.

## 2018-12-17 NOTE — ED Provider Notes (Signed)
[facility]    CSN: [id number] Arrival date & time: 12/17/18  1803      History   Chief Complaint Chief Complaint  Patient presents with  . Appointment    610  . Rash    HPI Ingrida Pudlo is a 23 y.o. female.   Asya Cosio presents with complaints of bumps to her vulva which she noted a few days ago, has had some drainage. Have been uncomfortable, some itching as well. No other vaginal discharge. Denies any previous similar. Does shave her pubic hair. She is sexually active with one partner, doesn't use condoms. No specific known exposure to STD. No urinary symptoms, no pelvic pain. Without contributing medical history.     ROS per HPI, negative if not otherwise mentioned.      History reviewed. No pertinent past medical history.  Patient Active Problem List   Diagnosis Date Noted  . Status post primary low transverse cesarean section--NRFHR 02/26/2017  . Acute blood loss anemia 02/26/2017  . Indication for care or intervention in labor or delivery 02/23/2017    Past Surgical History:  Procedure Laterality Date  . CESAREAN SECTION N/A 02/23/2017   Procedure: CESAREAN SECTION;  Surgeon: Jaymes Graff, MD;  [facility];  Service: Obstetrics;  Laterality: N/A;    OB History    Gravida  1   Para      Term      Preterm      AB      Living        SAB      TAB      Ectopic      Multiple      Live Births               Home Medications    Prior to Admission medications   Medication Sig Start Date End Date Taking? Authorizing Provider  acetaminophen (TYLENOL) 500 MG tablet Take 1,000 mg every 6 (six) hours as needed by mouth for mild pain.    [provider]  ibuprofen (ADVIL,MOTRIN) 600 MG tablet Take 1 tablet (600 mg total) every 6 (six) hours as needed by mouth. 02/26/17   Nigel Bridgeman, CNM  iron polysaccharides (NIFEREX) 150 MG capsule Take 1 capsule (150 mg total) daily by mouth. 02/26/17   Nigel Bridgeman, CNM  oxyCODONE-acetaminophen (PERCOCET/ROXICET) 5-325 MG tablet Take 1 tablet every 4 (four) hours as needed by mouth for moderate pain (pain 4-7). 02/26/17   Nigel Bridgeman, CNM  Prenatal Vit-Fe Fumarate-FA (PRENATAL MULTIVITAMIN) TABS tablet Take 1 tablet by mouth daily after breakfast.     [provider]  valACYclovir (VALTREX) 1000 MG tablet Take 1 tablet (1,000 mg total) by mouth 2 (two) times daily for 7 days. 12/17/18 12/24/18  Georgetta Haber, NP    Family History Family History  Problem Relation Age of Onset  . Hypertension Mother   . Hyperlipidemia Mother   . Hypertension Father   . Heart failure Father   . Diabetes Maternal Grandmother   . Hyperlipidemia Maternal Grandmother   . Hypertension Maternal Grandmother   . Hypertension Maternal Grandfather   . Diabetes Paternal Grandmother   . Hypertension Paternal Grandmother   . Hypertension Paternal Grandfather   . Heart failure Paternal Grandfather     Social History Social History   Tobacco Use  . Smoking status: Never Smoker  . Smokeless tobacco: Never Used  Substance Use Topics  . Alcohol use: No    Comment: socially  .  Drug use: No     Allergies   Patient has no known allergies.   Review of Systems Review of Systems   Physical Exam Triage Vital Signs ED Triage Vitals  Enc Vitals Group     BP 12/17/18 1822 104/64     Pulse Rate 12/17/18 1822 68     Resp 12/17/18 1822 16     Temp 12/17/18 1822 98 F (36.7 C)     Temp Source 12/17/18 1822 Oral     SpO2 12/17/18 1822 100 %     Weight 12/17/18 1821 170 lb (77.1 kg)     Height --      Head Circumference --      Peak Flow --      Pain Score 12/17/18 1820 5     Pain Loc --      Pain Edu? --      Excl. in GC? --    No data found.  Updated Vital Signs BP 104/64 (BP Location: Right Arm)   Pulse 68   Temp 98 F (36.7 C) (Oral)   Resp 16   Wt 170 lb (77.1 kg)   LMP 11/30/2018   SpO2 100%   BMI 31.09 kg/m   Visual Acuity Right  Eye Distance:   Left Eye Distance:   Bilateral Distance:    Right Eye Near:   Left Eye Near:    Bilateral Near:     Physical Exam Constitutional:      General: She is not in acute distress.    Appearance: She is well-developed.  Cardiovascular:     Rate and Rhythm: Normal rate.  Pulmonary:     Effort: Pulmonary effort is normal.  Abdominal:     Tenderness: There is no abdominal tenderness.  Genitourinary:    Labia:        Right: Lesion present.        Left: Lesion present.        Comments: Scattered ulcerations to bilateral labia majora; no active drainage or pustule present; culture obtained Skin:    General: Skin is warm and dry.  Neurological:     Mental Status: She is alert and oriented to person, place, and time.      UC Treatments / Results  Labs (all labs ordered are listed, but only abnormal results are displayed) Labs Reviewed  HSV CULTURE AND TYPING    EKG   Radiology No results found.  Procedures Procedures (including critical care time)  Medications Ordered in UC Medications - No data to display  Initial Impression / Assessment and Plan / UC Course  I have reviewed the triage vital signs and the nursing notes.  Pertinent labs & imaging results that were available during my care of the patient were reviewed by me and considered in my medical decision making (see chart for details).     Concern for genital herpes, with culture obtained, valtrex initiated. Will notify of any positive findings and if any changes to treatment are needed.  Follow up discussed. Safe sex encouraged. Patient verbalized understanding and agreeable to plan.   Final Clinical Impressions(s) / UC Diagnoses   Final diagnoses:  Vulvar ulceration     Discharge Instructions     I am concerned about herpes with what I am seeing today, therefore we will start medication to treat this.  Culture is pending, will take a few days to come back. This test can have some false  negatives, but can be fairly accurate. Will notify  of any positive findings and if any changes to treatment are needed.  You may monitor your results on your MyChart online as well.   I would avoid intercourse or further shaving until this has healed.  Please return to be seen or follow up with your PCP for any persistent symptoms or concerned.    ED Prescriptions    Medication Sig Dispense Auth. Provider   valACYclovir (VALTREX) 1000 MG tablet Take 1 tablet (1,000 mg total) by mouth 2 (two) times daily for 7 days. 14 tablet Zigmund Gottron, NP     Controlled Substance Prescriptions [place] Controlled Substance Registry consulted? Not Applicable   Zigmund Gottron, NP 12/17/18 [phone]

## 2018-12-17 NOTE — ED Triage Notes (Signed)
Pt states she has a vaginal rash that's been there 2 days.

## 2018-12-21 LAB — HSV CULTURE AND TYPING

## 2018-12-22 ENCOUNTER — Encounter (HOSPITAL_COMMUNITY): Payer: Self-pay

## 2018-12-22 ENCOUNTER — Telehealth (HOSPITAL_COMMUNITY): Payer: Self-pay | Admitting: Emergency Medicine

## 2018-12-22 NOTE — Telephone Encounter (Signed)
Culture positive for HSV 1, attempted to reach patient, no answer, left voicemail.

## 2018-12-24 ENCOUNTER — Telehealth (HOSPITAL_COMMUNITY): Payer: Self-pay | Admitting: Emergency Medicine

## 2018-12-24 NOTE — Telephone Encounter (Signed)
Pt read my message in Mychart about lab results. Called once more to see if she had any questions, pt answered, hung up, I retried to call and no answer, left final VM.

## 2020-03-18 ENCOUNTER — Other Ambulatory Visit: Payer: Self-pay | Admitting: Obstetrics & Gynecology

## 2020-03-18 DIAGNOSIS — E079 Disorder of thyroid, unspecified: Secondary | ICD-10-CM

## 2020-03-18 DIAGNOSIS — E049 Nontoxic goiter, unspecified: Secondary | ICD-10-CM | POA: Insufficient documentation

## 2020-04-01 ENCOUNTER — Ambulatory Visit
Admission: RE | Admit: 2020-04-01 | Discharge: 2020-04-01 | Disposition: A | Discharge status: Home / Self Care | Payer: BC Managed Care – PPO | Source: Ambulatory Visit | Attending: Obstetrics & Gynecology | Admitting: Obstetrics & Gynecology

## 2020-04-01 DIAGNOSIS — E079 Disorder of thyroid, unspecified: Secondary | ICD-10-CM

## 2020-04-19 ENCOUNTER — Other Ambulatory Visit: Payer: Self-pay | Admitting: Obstetrics & Gynecology

## 2020-05-23 ENCOUNTER — Ambulatory Visit (HOSPITAL_COMMUNITY)
Admission: EM | Admit: 2020-05-23 | Discharge: 2020-05-23 | Disposition: A | Discharge status: Home / Self Care | Payer: BC Managed Care – PPO

## 2020-05-23 ENCOUNTER — Encounter (HOSPITAL_COMMUNITY): Payer: Self-pay

## 2020-05-23 ENCOUNTER — Other Ambulatory Visit: Payer: Self-pay

## 2020-05-23 DIAGNOSIS — H539 Unspecified visual disturbance: Secondary | ICD-10-CM | POA: Diagnosis not present

## 2020-05-23 DIAGNOSIS — S0083XA Contusion of other part of head, initial encounter: Secondary | ICD-10-CM | POA: Diagnosis not present

## 2020-05-23 DIAGNOSIS — S00511A Abrasion of lip, initial encounter: Secondary | ICD-10-CM | POA: Diagnosis not present
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
## 2020-05-23 NOTE — Discharge Instructions (Signed)
Go directly to the ER for worsening visual changes, severe headache, dizziness, nausea, vomiting
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
## 2021-03-31 LAB — HEPATITIS C ANTIBODY: HCV Ab: POSITIVE

## 2021-03-31 LAB — OB RESULTS CONSOLE ABO/RH: RH Type: POSITIVE

## 2021-03-31 LAB — OB RESULTS CONSOLE GC/CHLAMYDIA
Chlamydia: NEGATIVE
Gonorrhea: NEGATIVE

## 2021-03-31 LAB — OB RESULTS CONSOLE RPR: RPR: NONREACTIVE

## 2021-03-31 LAB — OB RESULTS CONSOLE HIV ANTIBODY (ROUTINE TESTING): HIV: NONREACTIVE

## 2021-03-31 LAB — OB RESULTS CONSOLE HEPATITIS B SURFACE ANTIGEN: Hepatitis B Surface Ag: NEGATIVE

## 2021-03-31 LAB — OB RESULTS CONSOLE RUBELLA ANTIBODY, IGM: Rubella: IMMUNE

## 2021-03-31 LAB — OB RESULTS CONSOLE ANTIBODY SCREEN: Antibody Screen: NEGATIVE

## 2021-04-14 ENCOUNTER — Telehealth: Payer: Self-pay | Admitting: Hematology

## 2021-04-14 NOTE — Telephone Encounter (Signed)
Scheduled appt per 12/21 referral. Pt is aware of appt date and time. Pt is aware to arrive 15 mins prior to appt time.  °

## 2021-05-02 ENCOUNTER — Inpatient Hospital Stay: Payer: 59 | Attending: Hematology | Admitting: Hematology

## 2021-05-02 ENCOUNTER — Other Ambulatory Visit: Payer: Self-pay

## 2021-05-02 ENCOUNTER — Inpatient Hospital Stay: Payer: 59

## 2021-05-02 VITALS — BP 124/56 | HR 114 | Temp 97.5°F | Resp 19 | Wt 209.4 lb

## 2021-05-02 DIAGNOSIS — O99012 Anemia complicating pregnancy, second trimester: Secondary | ICD-10-CM

## 2021-05-02 DIAGNOSIS — D649 Anemia, unspecified: Secondary | ICD-10-CM | POA: Insufficient documentation

## 2021-05-02 DIAGNOSIS — Z3A20 20 weeks gestation of pregnancy: Secondary | ICD-10-CM | POA: Diagnosis not present

## 2021-05-02 LAB — CBC WITH DIFFERENTIAL/PLATELET
Abs Immature Granulocytes: 0.02 10*3/uL (ref 0.00–0.07)
Basophils Absolute: 0 10*3/uL (ref 0.0–0.1)
Basophils Relative: 1 %
Eosinophils Absolute: 0.1 10*3/uL (ref 0.0–0.5)
Eosinophils Relative: 1 %
HCT: 25.6 % — ABNORMAL LOW (ref 36.0–46.0)
Hemoglobin: 7.5 g/dL — ABNORMAL LOW (ref 12.0–15.0)
Immature Granulocytes: 0 %
Lymphocytes Relative: 26 %
Lymphs Abs: 2.1 10*3/uL (ref 0.7–4.0)
MCH: 18.5 pg — ABNORMAL LOW (ref 26.0–34.0)
MCHC: 29.3 g/dL — ABNORMAL LOW (ref 30.0–36.0)
MCV: 63.1 fL — ABNORMAL LOW (ref 80.0–100.0)
Monocytes Absolute: 0.6 10*3/uL (ref 0.1–1.0)
Monocytes Relative: 8 %
Neutro Abs: 5.2 10*3/uL (ref 1.7–7.7)
Neutrophils Relative %: 64 %
Platelets: 295 10*3/uL (ref 150–400)
RBC: 4.06 MIL/uL (ref 3.87–5.11)
RDW: 20.2 % — ABNORMAL HIGH (ref 11.5–15.5)
WBC: 8.1 10*3/uL (ref 4.0–10.5)
nRBC: 0 % (ref 0.0–0.2)

## 2021-05-02 LAB — CMP (CANCER CENTER ONLY)
ALT: 5 U/L (ref 0–44)
AST: 9 U/L — ABNORMAL LOW (ref 15–41)
Albumin: 3.5 g/dL (ref 3.5–5.0)
Alkaline Phosphatase: 45 U/L (ref 38–126)
Anion gap: 9 (ref 5–15)
BUN: 5 mg/dL — ABNORMAL LOW (ref 6–20)
CO2: 21 mmol/L — ABNORMAL LOW (ref 22–32)
Calcium: 8.6 mg/dL — ABNORMAL LOW (ref 8.9–10.3)
Chloride: 105 mmol/L (ref 98–111)
Creatinine: 0.45 mg/dL (ref 0.44–1.00)
GFR, Estimated: >60 mL/min (ref >60)
Glucose, Bld: 95 mg/dL (ref 70–99)
Potassium: 3.4 mmol/L — ABNORMAL LOW (ref 3.5–5.1)
Sodium: 135 mmol/L (ref 135–145)
Total Bilirubin: 0.3 mg/dL (ref 0.3–1.2)
Total Protein: 6.8 g/dL (ref 6.5–8.1)

## 2021-05-02 LAB — IRON AND IRON BINDING CAPACITY (CC-WL,HP ONLY)
Iron: 25 ug/dL — ABNORMAL LOW (ref 28–170)
Saturation Ratios: 4 % — ABNORMAL LOW (ref 10.4–31.8)
TIBC: 574 ug/dL — ABNORMAL HIGH (ref 250–450)
UIBC: 549 ug/dL — ABNORMAL HIGH (ref 148–442)

## 2021-05-02 LAB — FERRITIN: Ferritin: <4 ng/mL — ABNORMAL LOW (ref 11–307)

## 2021-05-02 LAB — VITAMIN B12: Vitamin B-12: 119 pg/mL — ABNORMAL LOW (ref 180–914)

## 2021-05-02 NOTE — Progress Notes (Addendum)
HEMATOLOGY/ONCOLOGY CONSULTATION NOTE  Date of Service: 05/02/2021  Patient Care Team: Patient, No Pcp Per (Inactive) as PCP - General (General Practice)  CHIEF COMPLAINTS/PURPOSE OF CONSULTATION:  Anemia in 2nd trimester of pregnancy  HISTORY OF PRESENTING ILLNESS:   Ashley Pruitt is a wonderful 26 y.o. female who has been referred to [place] by Oley Balm CMN/Dr. Osborn Coho MD for evaluation and management of Anemia in 2nd trimester of pregnancy.  Patient is a G2, P1 with her previous child being born on 02/23/2017 via C-section at 37.5 weeks. She notes that she has a history of heavy periods and iron deficiency anemia in the past.  Her hemoglobin after childbirth during her last pregnancy was 8.8.  She recently had labs on 04/05/2021 including CBC which shows hemoglobin of 7.8 with an MCV of 64 RDW of 19.9 normal WBC count of 6.5k normal platelets of 317k. Ferritin and iron profile not available in her referral. She does endorse having heavy periods and having chronic iron deficiency between her pregnancies. She has been taking iron polysaccharide once a day for the last couple of months and recently increased it to twice daily.  She is currently 20 weeks pregnancy.  She notes no overt lightheadedness or dizziness no chest pain or shortness of breath.  Does endorse significant fatigue.  Pica symptoms for ice. No other source of bleeding at this time.  No reported black stools or blood in the stools.  No hematuria.  No gum bleeds .  No epistaxis.  No vaginal bleeding. Notes no previous history of IV iron infusion or PRBC transfusion. Patient was referred to [place] for further evaluation and management of her iron deficiency anemia.  MEDICAL HISTORY:  Goiter Iron deficiency anemia Previous C-section  SURGICAL HISTORY: Past Surgical History:  Procedure Laterality Date   CESAREAN SECTION N/A 02/23/2017   Procedure: CESAREAN SECTION;  Surgeon: Jaymes Graff, MD;   [facility];  Service: Obstetrics;  Laterality: N/A;    SOCIAL HISTORY: Social History   Socioeconomic History   Marital status: Single    Spouse name: Not on file   Number of children: Not on file   Years of education: Not on file   Highest education level: Not on file  Occupational History   Not on file  Tobacco Use   Smoking status: Never   Smokeless tobacco: Never  Substance and Sexual Activity   Alcohol use: No    Comment: socially   Drug use: No   Sexual activity: Yes    Birth control/protection: Condom  Other Topics Concern   Not on file  Social History Narrative   Not on file   Social Determinants of Health   Financial Resource Strain: Not on file  Food Insecurity: Not on file  Transportation Needs: Not on file  Physical Activity: Not on file  Stress: Not on file  Social Connections: Not on file  Intimate Partner Violence: Not on file  Non-smoker Social alcohol use outside of pregnancy  FAMILY HISTORY: Family History  Problem Relation Age of Onset   Hypertension Mother    Hyperlipidemia Mother    Hypertension Father    Heart failure Father    Diabetes Maternal Grandmother    Hyperlipidemia Maternal Grandmother    Hypertension Maternal Grandmother    Hypertension Maternal Grandfather    Diabetes Paternal Grandmother    Hypertension Paternal Grandmother    Hypertension Paternal Grandfather    Heart failure Paternal Grandfather   Sister sickle cell trait  2 sisters with anemia Maternal grandfather prostate cancer Paternal grandfather prostate cancer  ALLERGIES:  has No Known Allergies.  MEDICATIONS:  Current Outpatient Medications  Medication Sig Dispense Refill   acetaminophen (TYLENOL) 500 MG tablet Take 1,000 mg every 6 (six) hours as needed by mouth for mild pain.     ibuprofen (ADVIL,MOTRIN) 600 MG tablet Take 1 tablet (600 mg total) every 6 (six) hours as needed by mouth. 30 tablet 2   iron polysaccharides (NIFEREX) 150 MG  capsule Take 1 capsule (150 mg total) daily by mouth. 30 capsule 1   oxyCODONE-acetaminophen (PERCOCET/ROXICET) 5-325 MG tablet Take 1 tablet every 4 (four) hours as needed by mouth for moderate pain (pain 4-7). 20 tablet 0   Prenatal Vit-Fe Fumarate-FA (PRENATAL MULTIVITAMIN) TABS tablet Take 1 tablet by mouth daily after breakfast.      No current facility-administered medications for this visit.    REVIEW OF SYSTEMS:    .10 Point review of Systems was done is negative except as noted above.  PHYSICAL EXAMINATION:  Vitals:   05/02/21 1318  BP: (!) 124/56  Pulse: (!) 114  Resp: 19  Temp: (!) 97.5 F (36.4 C)  SpO2: 100%   Filed Weights   05/02/21 1318  Weight: 209 lb 6.4 oz (95 kg)   .Body mass index is 38.3 kg/m.  GENERAL:alert, in no acute distress and comfortable SKIN: no acute rashes, no significant lesions EYES: conjunctiva are pink and non-injected, sclera anicteric OROPHARYNX: MMM, no exudates, no oropharyngeal erythema or ulceration NECK: supple, no JVD LYMPH:  no palpable lymphadenopathy in the cervical, axillary or inguinal regions LUNGS: clear to auscultation b/l with normal respiratory effort HEART: regular rate & rhythm ABDOMEN:  normoactive bowel sounds , non tender, not distended. Extremity: no pedal edema PSYCH: alert & oriented x 3 with fluent speech NEURO: no focal motor/sensory deficits  LABORATORY DATA:  I have reviewed the data as listed  . CBC Latest Ref Rng & Units 02/24/2017 02/23/2017 08/03/2016  WBC 4.0 - 10.5 K/uL 14.3(H) 9.5 9.6  Hemoglobin 12.0 - 15.0 g/dL 8.8(L) 10.0(L) 12.3  Hematocrit 36.0 - 46.0 % 27.3(L) 30.7(L) 36.7  Platelets 150 - 400 K/uL 233 231 320    . CMP Latest Ref Rng & Units 08/03/2016  Glucose 65 - 99 mg/dL 96  BUN 6 - 20 mg/dL <5(L)  Creatinine 0.44 - 1.00 mg/dL 0.50  Sodium 135 - 145 mmol/L 133(L)  Potassium 3.5 - 5.1 mmol/L 3.6  Chloride 101 - 111 mmol/L 104  CO2 22 - 32 mmol/L 21(L)  Calcium 8.9 - 10.3  mg/dL 9.2  Total Protein 6.5 - 8.1 g/dL 7.1  Total Bilirubin 0.3 - 1.2 mg/dL 0.4  Alkaline Phos 38 - 126 U/L 54  AST 15 - 41 U/L 17  ALT 14 - 54 U/L 10(L)     RADIOGRAPHIC STUDIES: I have personally reviewed the radiological images as listed and agreed with the findings in the report. No results found.  ASSESSMENT & PLAN:   26 year old female with history of heavy periods and chronic iron deficiency currently at [redacted] weeks gestation with  1) Severe microcytic anemia likely due to severe iron deficiency in second trimester pregnancy. 2) history of chronic iron deficiency anemia with heavy periods. PLAN -Patient was noted to have severe anemia with a hemoglobin of 7.8 a few weeks ago with her OB/GYN team.  She does not have overt presyncopal symptoms. -Has been started on oral iron for the last month or so and notes  that she has been taking this regularly. -We discussed that we should repeat labs today to see how she is responding to oral iron but that she will likely need IV iron given her severe anemia. -We will get labs today as noted below -Patient has a sister who has sickle cell trait but she notes that she has never been diagnosed with any hemoglobinopathies. -Has been eating well and drinking of water. -Is taking her prenatal vitamins regularly as well. -We will check her blood counts chemistries iron profile B12 -We will also check a hemoglobin electrophoresis to ensure her significant microcytosis does not represent any overt hemoglobinopathy.    Labs today Phone visit with Dr Irene Limbo in 1 week . Orders Placed This Encounter  Procedures   CBC with Differential/Platelet    Standing Status:   Future    Number of Occurrences:   1    Standing Expiration Date:   05/02/2022   CMP ([place] only)    Standing Status:   Future    Number of Occurrences:   1    Standing Expiration Date:   05/02/2022   Ferritin    Standing Status:   Future    Number of Occurrences:   1     Standing Expiration Date:   05/02/2022   Vitamin B12    Standing Status:   Future    Number of Occurrences:   1    Standing Expiration Date:   05/02/2022   Hgb Fractionation C[place]    Standing Status:   Future    Number of Occurrences:   1    Standing Expiration Date:   05/02/2022     All of the patients questions were answered with apparent satisfaction. The patient knows to call the clinic with any problems, questions or concerns.  I spent 35 mins counseling the patient face to face. The total time spent in the appointment was 45 mins  and more than 50% was on counseling and direct patient cares.    Sullivan Lone MD [facility] Hematology/Oncology Physician [facility]  (Office):       [phone] (Work cell):  [phone] (Fax):           [phone]  05/02/2021 12:30 PM

## 2021-05-04 ENCOUNTER — Telehealth: Payer: Self-pay | Admitting: Hematology

## 2021-05-04 LAB — HGB FRACTIONATION CASCADE
Hgb A2: 1.8 % (ref 1.8–3.2)
Hgb A: 98.2 % (ref 96.4–98.8)
Hgb F: 0 % (ref 0.0–2.0)
Hgb S: 0 %

## 2021-05-04 NOTE — Telephone Encounter (Signed)
Scheduled follow-up appointment per 11/7 los. Patient is aware. 

## 2021-05-09 ENCOUNTER — Inpatient Hospital Stay (HOSPITAL_BASED_OUTPATIENT_CLINIC_OR_DEPARTMENT_OTHER): Payer: 59 | Admitting: Hematology

## 2021-05-09 DIAGNOSIS — O99012 Anemia complicating pregnancy, second trimester: Secondary | ICD-10-CM

## 2021-05-09 DIAGNOSIS — D509 Iron deficiency anemia, unspecified: Secondary | ICD-10-CM | POA: Diagnosis not present

## 2021-05-09 MED ORDER — POLYSACCHARIDE IRON COMPLEX 150 MG PO CAPS: 150.0000 mg | ORAL_CAPSULE | Freq: Two times a day (BID) | ORAL | 4 refills | Status: DC

## 2021-05-09 MED ORDER — B-12 1000 MCG SL SUBL: 1000.0000 ug | SUBLINGUAL_TABLET | Freq: Every day | SUBLINGUAL | 5 refills | Status: DC

## 2021-05-10 ENCOUNTER — Telehealth: Payer: Self-pay | Admitting: Hematology

## 2021-05-10 NOTE — Telephone Encounter (Signed)
Scheduled follow-up appointment per 1/24 los. Patient is aware. °

## 2021-05-11 NOTE — Progress Notes (Signed)
HEMATOLOGY/ONCOLOGY PHONE VISIT NOTE  Date of Service: 05/11/2021  Patient Care Team: Patient, No Pcp Per (Inactive) as PCP - General (General Practice)  CHIEF COMPLAINTS/PURPOSE OF CONSULTATION:  Follow-up for discussion of labs and management of severe iron deficiency in second trimester pregnancy  HISTORY OF PRESENTING ILLNESS:   Ashley Pruitt is a wonderful 26 y.o. female who has been referred to [place] by Oley Balm CMN/Dr. Osborn Coho MD for evaluation and management of Anemia in 2nd trimester of pregnancy.  Patient is a G2, P1 with her previous child being born on 02/23/2017 via C-section at 37.5 weeks. She notes that she has a history of heavy periods and iron deficiency anemia in the past.  Her hemoglobin after childbirth during her last pregnancy was 8.8.  She recently had labs on 04/05/2021 including CBC which shows hemoglobin of 7.8 with an MCV of 64 RDW of 19.9 normal WBC count of 6.5k normal platelets of 317k. Ferritin and iron profile not available in her referral. She does endorse having heavy periods and having chronic iron deficiency between her pregnancies. She has been taking iron polysaccharide once a day for the last couple of months and recently increased it to twice daily.  She is currently 20 weeks pregnancy.  She notes no overt lightheadedness or dizziness no chest pain or shortness of breath.  Does endorse significant fatigue.  Pica symptoms for ice. No other source of bleeding at this time.  No reported black stools or blood in the stools.  No hematuria.  No gum bleeds .  No epistaxis.  No vaginal bleeding. Notes no previous history of IV iron infusion or PRBC transfusion. Patient was referred to [place] for further evaluation and management of her iron deficiency anemia.  INTERVAL HISTORY  .I connected with Ashley Pruitt on .05/09/2021 at  9:00 AM EST by telephone visit and verified that I am speaking with the correct person using two identifiers.    I discussed the limitations, risks, security and privacy concerns of performing an evaluation and management service by telemedicine and the availability of in-person appointments. I also discussed with the patient that there may be a patient responsible charge related to this service. The patient expressed understanding and agreed to proceed.   Other persons participating in the visit and their role in the encounter: None  Patients location: Home Providers location: [facility]  Chief Complaint: Lab review and discussion of management of severe iron deficiency in pregnancy  Patient was recently seen in the hematology clinic for evaluation of her anemia.  She notes no acute new symptoms in the interim since her last visit about a week ago.   Labs done show persistent significant anemia with a hemoglobin of 7.5 MCV of 63 with normal WBC count and platelets.  Her hemoglobin has not quite improved with oral iron and is likely to get worse with advancing pregnancy and increasing iron requirements. Ferritin less than 4 and an iron saturation of 4%. B12 higher than 119 CMP within normal limits other than a mild hypokalemia potassium 3.4 Hemoglobin electrophoresis within normal limits.  Alpha thalassemia trait not ruled out.  MEDICAL HISTORY:  Goiter Iron deficiency anemia Previous C-section  SURGICAL HISTORY: Past Surgical History:  Procedure Laterality Date   CESAREAN SECTION N/A 02/23/2017   Procedure: CESAREAN SECTION;  Surgeon: Jaymes Graff, MD;  [facility]: [facility];  Service: Obstetrics;  Laterality: N/A;    SOCIAL HISTORY: Social History   Socioeconomic History   Marital  status: Single    Spouse name: Not on file   Number of children: Not on file   Years of education: Not on file   Highest education level: Not on file  Occupational History   Not on file  Tobacco Use   Smoking status: Never   Smokeless tobacco: Never  Substance and Sexual  Activity   Alcohol use: No    Comment: socially   Drug use: No   Sexual activity: Yes    Birth control/protection: Condom  Other Topics Concern   Not on file  Social History Narrative   Not on file   Social Determinants of Health   Financial Resource Strain: Not on file  Food Insecurity: Not on file  Transportation Needs: Not on file  Physical Activity: Not on file  Stress: Not on file  Social Connections: Not on file  Intimate Partner Violence: Not on file  Non-smoker Social alcohol use outside of pregnancy  FAMILY HISTORY: Family History  Problem Relation Age of Onset   Hypertension Mother    Hyperlipidemia Mother    Hypertension Father    Heart failure Father    Diabetes Maternal Grandmother    Hyperlipidemia Maternal Grandmother    Hypertension Maternal Grandmother    Hypertension Maternal Grandfather    Diabetes Paternal Grandmother    Hypertension Paternal Grandmother    Hypertension Paternal Grandfather    Heart failure Paternal Grandfather   Sister sickle cell trait 2 sisters with anemia Maternal grandfather prostate cancer Paternal grandfather prostate cancer  ALLERGIES:  has No Known Allergies.  MEDICATIONS:  Current Outpatient Medications  Medication Sig Dispense Refill   Cyanocobalamin (B-12) 1000 MCG SUBL Place 1,000 mcg under the tongue daily. 30 tablet 5   acetaminophen (TYLENOL) 500 MG tablet Take 1,000 mg every 6 (six) hours as needed by mouth for mild pain.     ibuprofen (ADVIL,MOTRIN) 600 MG tablet Take 1 tablet (600 mg total) every 6 (six) hours as needed by mouth. 30 tablet 2   iron polysaccharides (NIFEREX) 150 MG capsule Take 1 capsule (150 mg total) by mouth 2 (two) times daily. 30 capsule 4   oxyCODONE-acetaminophen (PERCOCET/ROXICET) 5-325 MG tablet Take 1 tablet every 4 (four) hours as needed by mouth for moderate pain (pain 4-7). 20 tablet 0   Prenatal Vit-Fe Fumarate-FA (PRENATAL MULTIVITAMIN) TABS tablet Take 1 tablet by mouth daily  after breakfast.      No current facility-administered medications for this visit.    REVIEW OF SYSTEMS:    .10 Point review of Systems was done is negative except as noted above.  PHYSICAL EXAMINATION: Or acute new symptoms other than those noted above.  LABORATORY DATA:  I have reviewed the data as listed  . CBC Latest Ref Rng & Units 05/02/2021 02/24/2017 02/23/2017  WBC 4.0 - 10.5 K/uL 8.1 14.3(H) 9.5  Hemoglobin 12.0 - 15.0 g/dL 7.5(L) 8.8(L) 10.0(L)  Hematocrit 36.0 - 46.0 % 25.6(L) 27.3(L) 30.7(L)  Platelets 150 - 400 K/uL 295 233 231    . CMP Latest Ref Rng & Units 05/02/2021 08/03/2016  Glucose 70 - 99 mg/dL 95 96  BUN 6 - 20 mg/dL 5(L) <5(L)  Creatinine 0.44 - 1.00 mg/dL 0.45 0.50  Sodium 135 - 145 mmol/L 135 133(L)  Potassium 3.5 - 5.1 mmol/L 3.4(L) 3.6  Chloride 98 - 111 mmol/L 105 104  CO2 22 - 32 mmol/L 21(L) 21(L)  Calcium 8.9 - 10.3 mg/dL 8.6(L) 9.2  Total Protein 6.5 - 8.1 g/dL 6.8 7.1  Total Bilirubin 0.3 - 1.2 mg/dL 0.3 0.4  Alkaline Phos 38 - 126 U/L 45 54  AST 15 - 41 U/L 9(L) 17  ALT 0 - 44 U/L 5 10(L)   . Lab Results  Component Value Date   IRON 25 (L) 05/02/2021   TIBC 574 (H) 05/02/2021   IRONPCTSAT 4 (L) 05/02/2021   (Iron and TIBC)  Lab Results  Component Value Date   FERRITIN <4 (L) 05/02/2021   B12 --- 119  Component     Latest Ref Rng & Units 05/02/2021  HGB F     0.0 - 2.0 % 0.0  Hgb A     96.4 - 98.8 % 98.2  Hgb A2     1.8 - 3.2 % 1.8  HGB S     0.0 % 0.0  Interpretation, Hgb Fract      Comment   Normal hemoglobin present; no hemoglobin variant or beta            thalassemia           identified.  RADIOGRAPHIC STUDIES: I have personally reviewed the radiological images as listed and agreed with the findings in the report. No results found.  ASSESSMENT & PLAN:   26 year old female with history of heavy periods and chronic iron deficiency currently at [redacted] weeks gestation with  1) Severe microcytic anemia likely  due to severe iron deficiency in second trimester pregnancy. 2) history of chronic iron deficiency anemia with heavy periods. 3) vitamin B12 deficiency PLAN -Labs done show persistent significant anemia with a hemoglobin of 7.5 MCV of 63 with normal WBC count and platelets.  Her hemoglobin has not quite improved with oral iron and is likely to get worse with advancing pregnancy and increasing iron requirements. Ferritin less than 4 and an iron saturation of 4%. B12 higher than 119 CMP within normal limits other than a mild hypokalemia potassium 3.4 Hemoglobin electrophoresis within normal limits.  Alpha thalassemia trait not ruled out.  -We discussed the patient has very severe iron deficiency and severe anemia which has not responded to oral iron at this time.  Her iron needs are quite high and may not be replaced adequately adequately enough with oral iron at this time. -We discussed in detail the pros cons potential side effects and patient is agreeable with proceeding with IV iron infusions at this time. -We will set her up for IV Venofer 300 mg weekly x3 doses with acetaminophen and loratadine premedications at [facility] day center. -She will continue her oral iron polysaccharide 150 mg p.o. twice daily if tolerated or at least once a day if twice a day has not tolerated. -Continue prenatal vitamins -Additionally start B12 1000 mcg sublingual daily. -If her anemia and RBC microcytosis does not resolve with adequate correction of iron deficiency will need alpha thalassemia gene deletion studies to rule out alpha thalassemia trait/minor. -She was counseled to go to the emergency room ASAP at [facility] if she develops any lightheadedness or dizziness since that might be a reason to be transfused.  She could also call her OB/GYN team to arrange for PRBC transfusions if her symptoms of anemia worsen. -She does have a follow-up with her OB/GYN team for repeat labs in the next couple of  weeks.  Labs today IV Venofer 300 mg weekly x3 doses at [facility] day center ASAP Continue oral iron polysaccharide 150 mg p.o. twice daily Started on B12 1000 mcg sublingual daily Follow-up with OB/GYN with  repeat labs in 2 weeks as scheduled Return to clinic with Dr. Irene Limbo with labs in 4 to 5 weeks . No orders of the defined types were placed in this encounter.    All of the patients questions were answered with apparent satisfaction. The patient knows to call the clinic with any problems, questions or concerns.  . The total time spent in the appointment was 25 minutes to review her labs with her, discussion of the pros and cons of potential adverse effects of IV iron, rationale of IV Iron, discussion of B12 replacement, ordering and coordination of IV iron treatment.   Sullivan Lone MD [place] AAHIVMS [facility] Hematology/Oncology Physician [facility]

## 2021-05-22 ENCOUNTER — Inpatient Hospital Stay (HOSPITAL_COMMUNITY): Admission: RE | Admit: 2021-05-22 | Payer: 59 | Source: Ambulatory Visit

## 2021-05-29 ENCOUNTER — Encounter (HOSPITAL_COMMUNITY): Payer: 59

## 2021-05-30 ENCOUNTER — Other Ambulatory Visit: Payer: Self-pay

## 2021-05-30 ENCOUNTER — Encounter (HOSPITAL_COMMUNITY)
Admission: RE | Admit: 2021-05-30 | Discharge: 2021-05-30 | Disposition: A | Discharge status: Home / Self Care | Payer: No Typology Code available for payment source | Source: Ambulatory Visit | Attending: Hematology | Admitting: Hematology

## 2021-05-30 DIAGNOSIS — O99012 Anemia complicating pregnancy, second trimester: Secondary | ICD-10-CM | POA: Insufficient documentation

## 2021-05-30 DIAGNOSIS — D509 Iron deficiency anemia, unspecified: Secondary | ICD-10-CM | POA: Diagnosis present

## 2021-05-30 MED ORDER — ACETAMINOPHEN 325 MG PO TABS: 650.0000 mg | ORAL_TABLET | ORAL | Status: DC

## 2021-05-30 MED ORDER — LORATADINE 10 MG PO TABS
ORAL_TABLET | ORAL | Status: AC
  Administered 2021-05-30: 10 mg via ORAL
  Filled 2021-05-30: qty 1

## 2021-05-30 MED ORDER — SODIUM CHLORIDE 0.9 % IV SOLN
300.0000 mg | INTRAVENOUS | Status: DC
  Administered 2021-05-30: 300 mg via INTRAVENOUS
  Filled 2021-05-30: qty 300

## 2021-05-30 MED ORDER — ACETAMINOPHEN 325 MG PO TABS
ORAL_TABLET | ORAL | Status: AC
  Administered 2021-05-30: 650 mg via ORAL
  Filled 2021-05-30: qty 2

## 2021-05-30 MED ORDER — LORATADINE 10 MG PO TABS: 10.0000 mg | ORAL_TABLET | ORAL | Status: DC

## 2021-06-06 ENCOUNTER — Inpatient Hospital Stay (HOSPITAL_COMMUNITY): Admission: RE | Admit: 2021-06-06 | Payer: 59 | Source: Ambulatory Visit

## 2021-06-12 ENCOUNTER — Other Ambulatory Visit: Payer: Self-pay

## 2021-06-12 DIAGNOSIS — D509 Iron deficiency anemia, unspecified: Secondary | ICD-10-CM

## 2021-06-12 DIAGNOSIS — O99012 Anemia complicating pregnancy, second trimester: Secondary | ICD-10-CM

## 2021-06-13 ENCOUNTER — Ambulatory Visit (HOSPITAL_COMMUNITY): Payer: 59

## 2021-06-13 ENCOUNTER — Inpatient Hospital Stay: Payer: No Typology Code available for payment source

## 2021-06-13 ENCOUNTER — Inpatient Hospital Stay: Payer: No Typology Code available for payment source | Attending: Hematology | Admitting: Hematology

## 2021-06-14 DIAGNOSIS — Z369 Encounter for antenatal screening, unspecified: Secondary | ICD-10-CM | POA: Diagnosis not present

## 2021-07-26 ENCOUNTER — Other Ambulatory Visit: Payer: Self-pay | Admitting: Obstetrics & Gynecology

## 2021-08-13 ENCOUNTER — Other Ambulatory Visit: Payer: Self-pay

## 2021-08-13 ENCOUNTER — Encounter (HOSPITAL_COMMUNITY): Payer: Self-pay | Admitting: Obstetrics and Gynecology

## 2021-08-13 ENCOUNTER — Inpatient Hospital Stay (HOSPITAL_COMMUNITY)
Admission: AD | Admit: 2021-08-13 | Discharge: 2021-08-13 | Disposition: A | Discharge status: Home / Self Care | Payer: 59 | Attending: Obstetrics and Gynecology | Admitting: Obstetrics and Gynecology

## 2021-08-13 DIAGNOSIS — O26893 Other specified pregnancy related conditions, third trimester: Secondary | ICD-10-CM | POA: Diagnosis not present

## 2021-08-13 DIAGNOSIS — Z3A36 36 weeks gestation of pregnancy: Secondary | ICD-10-CM | POA: Diagnosis not present

## 2021-08-13 DIAGNOSIS — Z3689 Encounter for other specified antenatal screening: Secondary | ICD-10-CM | POA: Diagnosis not present

## 2021-08-13 DIAGNOSIS — R519 Headache, unspecified: Secondary | ICD-10-CM | POA: Insufficient documentation

## 2021-08-13 DIAGNOSIS — O1203 Gestational edema, third trimester: Secondary | ICD-10-CM | POA: Diagnosis not present

## 2021-08-13 LAB — URINALYSIS, ROUTINE W REFLEX MICROSCOPIC
Bilirubin Urine: NEGATIVE
Glucose, UA: NEGATIVE mg/dL
Hgb urine dipstick: NEGATIVE
Ketones, ur: NEGATIVE mg/dL
Nitrite: NEGATIVE
Protein, ur: 30 mg/dL — AB
Specific Gravity, Urine: 1.02 (ref 1.005–1.030)
pH: 6 (ref 5.0–8.0)

## 2021-08-13 MED ORDER — ACETAMINOPHEN 500 MG PO TABS
1000.0000 mg | ORAL_TABLET | Freq: Once | ORAL | Status: AC
  Administered 2021-08-13: 1000 mg via ORAL
  Filled 2021-08-13: qty 2

## 2021-08-13 NOTE — MAU Note (Signed)
.  Ross Hon is a 26 y.o. at [redacted]w[redacted]d here in MAU reporting: swelling in legs and feet x2 weeks and headache. Advised per MD to come and be evaluated. Pt  states she has had a mild headache all day. Rates it 5 on 0-10 pain scale. Hasn't taken any medications for pain.Denies visual changes or epigastric pain. ? ?Endorses + fetal movement. Denies SROM, vaginal bleeding or bloody show. Denies contractions or cramping. ? ?Pain score: 5 ?Vitals:  ? 08/13/21 1922  ?BP: 124/74  ?Pulse: (!) 105  ?Resp: 17  ?Temp: 98.9 ?F (37.2 ?C)  ?SpO2: 99%  ?   ?FHT:139bpm ?Lab orders placed from triage:  UA ? ?

## 2021-08-13 NOTE — MAU Provider Note (Signed)
?History  ?  ? ?[id number] ? ?Arrival date and time: 08/13/21 1841 ?  ? ?Chief Complaint  ?Patient presents with  ? Leg Swelling  ? Foot Swelling  ? Headache  ? ? ? ?HPI ?Ashley Pruitt is a 26 y.o. at [redacted]w[redacted]d who presents for headache & swelling. ?Reports bilateral lower extremity swelling for the last 2 weeks. Uncomfortable to wear shoes & socks due to swelling. Has had intermittent headaches for the last week. Frontal headache that is worse with light & stress. Hasn't been treating headaches. Denies fever, n/v, epigastric pain, contractions, vaginal bleeding. Reports good fetal movement. No history of hypertension.   ? ?OB History   ? ? Gravida  ?2  ? Para  ?1  ? Term  ?1  ? Preterm  ?   ? AB  ?   ? Living  ?1  ?  ? ? SAB  ?   ? IAB  ?   ? Ectopic  ?   ? Multiple  ?   ? Live Births  ?1  ?   ?  ?  ? ? ?History reviewed. No pertinent past medical history. ? ?Past Surgical History:  ?Procedure Laterality Date  ? CESAREAN SECTION N/A 02/23/2017  ? Procedure: CESAREAN SECTION;  Surgeon: Jaymes Graff, MD;  Location: [facility];  Service: Obstetrics;  Laterality: N/A;  ? ? ?Family History  ?Problem Relation Age of Onset  ? Hypertension Mother   ? Hyperlipidemia Mother   ? Hypertension Father   ? Heart failure Father   ? Diabetes Maternal Grandmother   ? Hyperlipidemia Maternal Grandmother   ? Hypertension Maternal Grandmother   ? Hypertension Maternal Grandfather   ? Diabetes Paternal Grandmother   ? Hypertension Paternal Grandmother   ? Hypertension Paternal Grandfather   ? Heart failure Paternal Grandfather   ? ? ?No Known Allergies ? ?No current facility-administered medications on file prior to encounter.  ? ?Current Outpatient Medications on File Prior to Encounter  ?Medication Sig Dispense Refill  ? iron polysaccharides (NIFEREX) 150 MG capsule Take 1 capsule (150 mg total) by mouth 2 (two) times daily. 30 capsule 4  ? Prenatal Vit-Fe Fumarate-FA (PRENATAL MULTIVITAMIN) TABS tablet Take 1 tablet by mouth  daily after breakfast.     ? acetaminophen (TYLENOL) 500 MG tablet Take 1,000 mg every 6 (six) hours as needed by mouth for mild pain.    ? Cyanocobalamin (B-12) 1000 MCG SUBL Place 1,000 mcg under the tongue daily. 30 tablet 5  ? ? ? ?ROS ?Pertinent positives and negative per HPI, all others reviewed and negative ? ?Physical Exam  ? ?BP 118/74   Pulse (!) 106   Temp 98.9 ?F (37.2 ?C) (Oral)   Resp 17   Ht 5\' 2"  (1.575 m)   Wt 96.8 kg   SpO2 99%   BMI 39.01 kg/m?  ? ?Patient Vitals for the past 24 hrs: ? BP Temp Temp src Pulse Resp SpO2 Height Weight  ?08/13/21 2001 118/74 -- -- (!) 106 -- -- -- --  ?08/13/21 1949 123/86 -- -- (!) 114 -- -- -- --  ?08/13/21 1922 124/74 98.9 ?F (37.2 ?C) Oral (!) 105 17 99 % 5\' 2"  (1.575 m) 96.8 kg  ? ? ?Physical Exam ?Vitals and nursing note reviewed.  ?Constitutional:   ?   General: She is not in acute distress. ?   Appearance: She is well-developed.  ?HENT:  ?   Head: Normocephalic and atraumatic.  ?Pulmonary:  ?   Effort:  Pulmonary effort is normal. No respiratory distress.  ?Musculoskeletal:  ?   Right lower leg: 1+ Pitting Edema present.  ?   Left lower leg: 1+ Pitting Edema present.  ?Skin: ?   General: Skin is warm and dry.  ?Neurological:  ?   Mental Status: She is alert.  ?   Deep Tendon Reflexes:  ?   Reflex Scores: ?     Patellar reflexes are 2+ on the right side and 2+ on the left side. ?   Comments: No clonus  ?Psychiatric:     ?   Mood and Affect: Mood normal.     ?   Behavior: Behavior normal.  ?  ? ? ?FHT ?Baseline 135, moderate variability, 15x15 accels, no decels ?Toco: irregular ?Cat: 1 ? ?Labs ?Results for orders placed or performed during the hospital encounter of 08/13/21 (from the past 24 hour(s))  ?Urinalysis, Routine w reflex microscopic Urine, Clean Catch     Status: Abnormal  ? Collection Time: 08/13/21  7:30 PM  ?Result Value Ref Range  ? Color, Urine YELLOW YELLOW  ? APPearance HAZY (A) CLEAR  ? Specific Gravity, Urine 1.020 1.005 - 1.030  ? pH  6.0 5.0 - 8.0  ? Glucose, UA NEGATIVE NEGATIVE mg/dL  ? Hgb urine dipstick NEGATIVE NEGATIVE  ? Bilirubin Urine NEGATIVE NEGATIVE  ? Ketones, ur NEGATIVE NEGATIVE mg/dL  ? Protein, ur 30 (A) NEGATIVE mg/dL  ? Nitrite NEGATIVE NEGATIVE  ? Leukocytes,Ua TRACE (A) NEGATIVE  ? RBC / HPF 0-5 0 - 5 RBC/hpf  ? WBC, UA 0-5 0 - 5 WBC/hpf  ? Bacteria, UA RARE (A) NONE SEEN  ? Squamous Epithelial / LPF 11-20 0 - 5  ? Mucus PRESENT   ? ? ?Imaging ?No results found. ? ?MAU Course  ?Procedures ?Lab Orders    ?     Urinalysis, Routine w reflex microscopic Urine, Clean Catch    ? ?Meds ordered this encounter  ?Medications  ? acetaminophen (TYLENOL) tablet 1,000 mg  ? ?Imaging Orders  ?No imaging studies ordered today  ? ? ?MDM ?Reactive NST ? ?Normotensive in MAU. Swelling appears appropriate for pregnancy. Reviewed [facility] prenatal records under media - normotensive in office.  ? ?Headache improved with tylenol ? ?Assessment and Plan  ? ?1. Pregnancy headache in third trimester  ?-Take tylenol prn headache ?-Reviewed reasons to return  ?2. Swelling of lower extremity during pregnancy in third trimester  ?-Recommend compression socks  ?3. [redacted] weeks gestation of pregnancy   ? ? ? ?Judeth Horn, NP ?08/13/21 ?9:24 PM ? ? ?

## 2021-08-17 DIAGNOSIS — R609 Edema, unspecified: Secondary | ICD-10-CM | POA: Diagnosis not present

## 2021-08-17 DIAGNOSIS — Z3492 Encounter for supervision of normal pregnancy, unspecified, second trimester: Secondary | ICD-10-CM | POA: Diagnosis not present

## 2021-08-17 DIAGNOSIS — Z331 Pregnant state, incidental: Secondary | ICD-10-CM | POA: Diagnosis not present

## 2021-08-17 DIAGNOSIS — Z3A36 36 weeks gestation of pregnancy: Secondary | ICD-10-CM | POA: Diagnosis not present

## 2021-08-17 DIAGNOSIS — Z6837 Body mass index (BMI) 37.0-37.9, adult: Secondary | ICD-10-CM | POA: Diagnosis not present

## 2021-08-17 DIAGNOSIS — R030 Elevated blood-pressure reading, without diagnosis of hypertension: Secondary | ICD-10-CM | POA: Diagnosis not present

## 2021-08-21 ENCOUNTER — Telehealth (HOSPITAL_COMMUNITY): Payer: Self-pay | Admitting: *Deleted

## 2021-08-21 ENCOUNTER — Encounter (HOSPITAL_COMMUNITY): Payer: Self-pay

## 2021-08-21 NOTE — Patient Instructions (Signed)
Ashley Pruitt ? 08/21/2021 ? ? Your procedure is scheduled on:  09/03/2021 ? Arrive at 0700 at [facility] [employer] on [address] at [facility]  and [employer]. You are invited to use the FREE valet parking or use the Visitor's parking deck. ? Pick up the phone at the desk and dial [phone]. ? Call this number if you have problems the morning of surgery: [phone] ? Remember: ? ? Do not eat food:(After Midnight) Desp?s de medianoche. ? Do not drink clear liquids: (After Midnight) Desp?s de medianoche. ? Take these medicines the morning of surgery with A SIP OF WATER:  none ? ? Do not wear jewelry, make-up or nail polish. ? Do not wear lotions, powders, or perfumes. Do not wear deodorant. ? Do not shave 48 hours prior to surgery. ? Do not bring valuables to the hospital.  [facility] is not  ? responsible for any belongings or valuables brought to the hospital. ? Contacts, dentures or bridgework may not be worn into surgery. ? Leave suitcase in the car. After surgery it may be brought to your room. ? For patients admitted to the hospital, checkout time is 11:00 AM the day of  ?            discharge. ? ?   ? Please read over the following fact sheets that you were given:  ?   Preparing for Surgery ? ? ?

## 2021-08-21 NOTE — Telephone Encounter (Signed)
Preadmission screen  

## 2021-08-22 ENCOUNTER — Encounter (HOSPITAL_COMMUNITY): Payer: Self-pay

## 2021-09-01 ENCOUNTER — Other Ambulatory Visit: Payer: Self-pay

## 2021-09-01 ENCOUNTER — Inpatient Hospital Stay (EMERGENCY_DEPARTMENT_HOSPITAL)
Admission: AD | Admit: 2021-09-01 | Discharge: 2021-09-01 | Disposition: A | Discharge status: Home / Self Care | Payer: 59 | Source: Home / Self Care | Attending: Obstetrics & Gynecology | Admitting: Obstetrics & Gynecology

## 2021-09-01 ENCOUNTER — Encounter (HOSPITAL_COMMUNITY)
Admission: RE | Admit: 2021-09-01 | Discharge: 2021-09-01 | Disposition: A | Discharge status: Home / Self Care | Payer: 59 | Source: Ambulatory Visit | Attending: Obstetrics and Gynecology | Admitting: Obstetrics and Gynecology

## 2021-09-01 ENCOUNTER — Encounter (HOSPITAL_COMMUNITY): Payer: Self-pay | Admitting: Obstetrics & Gynecology

## 2021-09-01 DIAGNOSIS — O471 False labor at or after 37 completed weeks of gestation: Secondary | ICD-10-CM | POA: Insufficient documentation

## 2021-09-01 DIAGNOSIS — Z01812 Encounter for preprocedural laboratory examination: Secondary | ICD-10-CM | POA: Insufficient documentation

## 2021-09-01 DIAGNOSIS — O479 False labor, unspecified: Secondary | ICD-10-CM

## 2021-09-01 DIAGNOSIS — Z3A00 Weeks of gestation of pregnancy not specified: Secondary | ICD-10-CM | POA: Insufficient documentation

## 2021-09-01 LAB — CBC
HCT: 27.2 % — ABNORMAL LOW (ref 36.0–46.0)
Hemoglobin: 7.8 g/dL — ABNORMAL LOW (ref 12.0–15.0)
MCH: 18.7 pg — ABNORMAL LOW (ref 26.0–34.0)
MCHC: 28.7 g/dL — ABNORMAL LOW (ref 30.0–36.0)
MCV: 65.2 fL — ABNORMAL LOW (ref 80.0–100.0)
Platelets: 214 10*3/uL (ref 150–400)
RBC: 4.17 MIL/uL (ref 3.87–5.11)
RDW: 21.2 % — ABNORMAL HIGH (ref 11.5–15.5)
WBC: 5.3 10*3/uL (ref 4.0–10.5)
nRBC: 0.4 % — ABNORMAL HIGH (ref 0.0–0.2)

## 2021-09-01 LAB — BASIC METABOLIC PANEL
Anion gap: 10 (ref 5–15)
BUN: <5 mg/dL — ABNORMAL LOW (ref 6–20)
CO2: 19 mmol/L — ABNORMAL LOW (ref 22–32)
Calcium: 8.6 mg/dL — ABNORMAL LOW (ref 8.9–10.3)
Chloride: 108 mmol/L (ref 98–111)
Creatinine, Ser: 0.55 mg/dL (ref 0.44–1.00)
GFR, Estimated: >60 mL/min (ref >60)
Glucose, Bld: 78 mg/dL (ref 70–99)
Potassium: 3.8 mmol/L (ref 3.5–5.1)
Sodium: 137 mmol/L (ref 135–145)

## 2021-09-01 LAB — TYPE AND SCREEN
ABO/RH(D): A POS
Antibody Screen: NEGATIVE

## 2021-09-01 LAB — RPR: RPR Ser Ql: NONREACTIVE

## 2021-09-01 NOTE — MAU Note (Signed)
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 Denies  any S/S now-  Not taking any med. GBS-  neg Unsure if having UC's  Feels baby moving

## 2021-09-01 NOTE — MAU Note (Signed)
I have communicated with Dr. Ephriam Jenkins and reviewed vital signs:  Vitals:   09/01/21 2029 09/01/21 2145  BP: 113/75 116/61  Pulse: 98 97  Resp: 20   Temp: 99 F (37.2 C)     Vaginal exam:  Dilation: Closed Effacement (%): 50 Cervical Position: Posterior Presentation: Vertex Exam by:: Ginnie Smart RN,   Also reviewed contraction pattern and that non-stress test is reactive.  It has been documented that patient is contracting irregularly not indicating active labor.  Patient denies any other complaints.  Based on this report provider has given order for discharge.  A discharge order and diagnosis entered by a provider.   Labor discharge instructions reviewed with patient.

## 2021-09-01 NOTE — MAU Provider Note (Signed)
S: Patient is here for RN labor evaluation. Fetal tracing, vital signs, & chart reviewed   O:  Vitals:   09/01/21 2029  BP: 113/75  Pulse: 98  Resp: 20  Temp: 99 F (37.2 C)  TempSrc: Oral  Weight: 98.3 kg  Height: 5\' 2"  (1.575 m)   Results for orders placed or performed during the hospital encounter of 09/01/21 (from the past 24 hour(s))  RPR     Status: None   Collection Time: 09/01/21  9:07 AM  Result Value Ref Range   RPR Ser Ql NON REACTIVE NON REACTIVE  CBC     Status: Abnormal   Collection Time: 09/01/21  9:07 AM  Result Value Ref Range   WBC 5.3 4.0 - 10.5 K/uL   RBC 4.17 3.87 - 5.11 MIL/uL   Hemoglobin 7.8 (L) 12.0 - 15.0 g/dL   HCT 09/03/21 (L) 56.3 - 87.5 %   MCV 65.2 (L) 80.0 - 100.0 fL   MCH 18.7 (L) 26.0 - 34.0 pg   MCHC 28.7 (L) 30.0 - 36.0 g/dL   RDW 64.3 (H) 32.9 - 51.8 %   Platelets 214 150 - 400 K/uL   nRBC 0.4 (H) 0.0 - 0.2 %  Basic metabolic panel     Status: Abnormal   Collection Time: 09/01/21  9:07 AM  Result Value Ref Range   Sodium 137 135 - 145 mmol/L   Potassium 3.8 3.5 - 5.1 mmol/L   Chloride 108 98 - 111 mmol/L   CO2 19 (L) 22 - 32 mmol/L   Glucose, Bld 78 70 - 99 mg/dL   BUN <5 (L) 6 - 20 mg/dL   Creatinine, Ser 09/03/21 0.44 - 1.00 mg/dL   Calcium 8.6 (L) 8.9 - 10.3 mg/dL   GFR, Estimated 6.60 >63 mL/min   Anion gap 10 5 - 15  Type and screen     Status: None   Collection Time: 09/01/21  9:08 AM  Result Value Ref Range   ABO/RH(D) A POS    Antibody Screen NEG    Sample Expiration      09/04/2021,2359 Performed at [facility], [address] [place], [place]     Dilation: Closed Effacement (%): 50 Cervical Position: Posterior Presentation: Vertex Exam by:: [email or web address] RN   FHR: 150 bpm, Mod Var, no Decels, + Accels UC: irregular, every ~7-9 min Reactive NST  A: 1. False labor   2. Reactive NST   P:  RN to discharge home in stable condition with return precautions & fetal kick counts  Ginnie Smart, MD,  [facility] OB Fellow, Faculty Practice

## 2021-09-02 ENCOUNTER — Inpatient Hospital Stay (HOSPITAL_COMMUNITY)
Admission: AD | Admit: 2021-09-02 | Discharge: 2021-09-05 | DRG: 788 | Disposition: A | Discharge status: Home / Self Care | Payer: 59 | Attending: Obstetrics & Gynecology | Admitting: Obstetrics & Gynecology

## 2021-09-02 ENCOUNTER — Encounter (HOSPITAL_COMMUNITY): Payer: Self-pay | Admitting: Obstetrics & Gynecology

## 2021-09-02 DIAGNOSIS — O9902 Anemia complicating childbirth: Secondary | ICD-10-CM | POA: Diagnosis not present

## 2021-09-02 DIAGNOSIS — O99012 Anemia complicating pregnancy, second trimester: Secondary | ICD-10-CM

## 2021-09-02 DIAGNOSIS — D509 Iron deficiency anemia, unspecified: Secondary | ICD-10-CM | POA: Diagnosis not present

## 2021-09-02 DIAGNOSIS — O99891 Other specified diseases and conditions complicating pregnancy: Principal | ICD-10-CM

## 2021-09-02 DIAGNOSIS — Z98891 History of uterine scar from previous surgery: Principal | ICD-10-CM

## 2021-09-02 DIAGNOSIS — O99214 Obesity complicating childbirth: Secondary | ICD-10-CM | POA: Diagnosis present

## 2021-09-02 DIAGNOSIS — Z3A39 39 weeks gestation of pregnancy: Secondary | ICD-10-CM

## 2021-09-02 DIAGNOSIS — O34211 Maternal care for low transverse scar from previous cesarean delivery: Secondary | ICD-10-CM | POA: Diagnosis not present

## 2021-09-02 LAB — CBC
HCT: 23.1 % — ABNORMAL LOW (ref 36.0–46.0)
Hemoglobin: 6.9 g/dL — CL (ref 12.0–15.0)
MCH: 19.4 pg — ABNORMAL LOW (ref 26.0–34.0)
MCHC: 29.9 g/dL — ABNORMAL LOW (ref 30.0–36.0)
MCV: 64.9 fL — ABNORMAL LOW (ref 80.0–100.0)
Platelets: 205 10*3/uL (ref 150–400)
RBC: 3.56 MIL/uL — ABNORMAL LOW (ref 3.87–5.11)
RDW: 20.9 % — ABNORMAL HIGH (ref 11.5–15.5)
WBC: 5.8 10*3/uL (ref 4.0–10.5)
nRBC: 0 % (ref 0.0–0.2)

## 2021-09-02 LAB — PREPARE RBC (CROSSMATCH)

## 2021-09-02 MED ORDER — CEFAZOLIN SODIUM-DEXTROSE 2-4 GM/100ML-% IV SOLN
2.0000 g | INTRAVENOUS | Status: AC
  Administered 2021-09-03: 2 g via INTRAVENOUS
  Filled 2021-09-02: qty 100

## 2021-09-02 MED ORDER — ACETAMINOPHEN 325 MG PO TABS: 650.0000 mg | ORAL_TABLET | ORAL | Status: DC | PRN

## 2021-09-02 MED ORDER — SODIUM CHLORIDE 0.9 % IV SOLN: INTRAVENOUS | Status: DC

## 2021-09-02 MED ORDER — POVIDONE-IODINE 10 % EX SWAB
2.0000 "application " | Freq: Once | CUTANEOUS | Status: AC
  Administered 2021-09-03: 2 via TOPICAL

## 2021-09-02 MED ORDER — CALCIUM CARBONATE ANTACID 500 MG PO CHEW: 2.0000 | CHEWABLE_TABLET | ORAL | Status: DC | PRN

## 2021-09-02 MED ORDER — PRENATAL MULTIVITAMIN CH
1.0000 | ORAL_TABLET | Freq: Every day | ORAL | Status: DC
  Administered 2021-09-04 – 2021-09-05 (×2): 1 via ORAL
  Filled 2021-09-02 (×2): qty 1

## 2021-09-02 MED ORDER — ZOLPIDEM TARTRATE 5 MG PO TABS: 5.0000 mg | ORAL_TABLET | Freq: Every evening | ORAL | Status: DC | PRN

## 2021-09-02 MED ORDER — LACTATED RINGERS IV SOLN: INTRAVENOUS | Status: DC

## 2021-09-02 MED ORDER — DOCUSATE SODIUM 100 MG PO CAPS
100.0000 mg | ORAL_CAPSULE | Freq: Every day | ORAL | Status: DC
  Administered 2021-09-04 – 2021-09-05 (×2): 100 mg via ORAL
  Filled 2021-09-02 (×2): qty 1

## 2021-09-03 ENCOUNTER — Encounter (HOSPITAL_COMMUNITY)
Admission: AD | Disposition: A | Discharge status: Home / Self Care | Payer: Self-pay | Source: Home / Self Care | Attending: Obstetrics & Gynecology

## 2021-09-03 ENCOUNTER — Inpatient Hospital Stay (HOSPITAL_COMMUNITY): Admission: RE | Admit: 2021-09-03 | Payer: 59 | Source: Home / Self Care | Admitting: Obstetrics & Gynecology

## 2021-09-03 ENCOUNTER — Inpatient Hospital Stay (HOSPITAL_COMMUNITY): Payer: 59 | Admitting: Certified Registered Nurse Anesthetist

## 2021-09-03 ENCOUNTER — Encounter (HOSPITAL_COMMUNITY): Payer: Self-pay | Admitting: Obstetrics & Gynecology

## 2021-09-03 DIAGNOSIS — Z3A39 39 weeks gestation of pregnancy: Secondary | ICD-10-CM

## 2021-09-03 DIAGNOSIS — O34211 Maternal care for low transverse scar from previous cesarean delivery: Secondary | ICD-10-CM

## 2021-09-03 DIAGNOSIS — Z98891 History of uterine scar from previous surgery: Principal | ICD-10-CM

## 2021-09-03 LAB — CBC
HCT: 31.4 % — ABNORMAL LOW (ref 36.0–46.0)
Hemoglobin: 9.3 g/dL — ABNORMAL LOW (ref 12.0–15.0)
MCH: 20.7 pg — ABNORMAL LOW (ref 26.0–34.0)
MCHC: 29.6 g/dL — ABNORMAL LOW (ref 30.0–36.0)
MCV: 69.9 fL — ABNORMAL LOW (ref 80.0–100.0)
Platelets: 202 10*3/uL (ref 150–400)
RBC: 4.49 MIL/uL (ref 3.87–5.11)
RDW: 24.3 % — ABNORMAL HIGH (ref 11.5–15.5)
WBC: 7.5 10*3/uL (ref 4.0–10.5)
nRBC: 0.4 % — ABNORMAL HIGH (ref 0.0–0.2)

## 2021-09-03 SURGERY — Surgical Case
Anesthesia: Spinal

## 2021-09-03 MED ORDER — MENTHOL 3 MG MT LOZG: 1.0000 | LOZENGE | OROMUCOSAL | Status: DC | PRN

## 2021-09-03 MED ORDER — NALOXONE HCL 0.4 MG/ML IJ SOLN: 0.4000 mg | INTRAMUSCULAR | Status: DC | PRN

## 2021-09-03 MED ORDER — HYDROMORPHONE HCL 1 MG/ML IJ SOLN: 0.2000 mg | INTRAMUSCULAR | Status: DC | PRN

## 2021-09-03 MED ORDER — ACETAMINOPHEN 10 MG/ML IV SOLN
INTRAVENOUS | Status: DC | PRN
  Administered 2021-09-03: 1000 mg via INTRAVENOUS

## 2021-09-03 MED ORDER — SODIUM CHLORIDE 0.9% FLUSH: 3.0000 mL | INTRAVENOUS | Status: DC | PRN

## 2021-09-03 MED ORDER — OXYTOCIN-SODIUM CHLORIDE 30-0.9 UT/500ML-% IV SOLN
INTRAVENOUS | Status: DC | PRN
  Administered 2021-09-03: 30 [IU] via INTRAVENOUS

## 2021-09-03 MED ORDER — ACETAMINOPHEN 10 MG/ML IV SOLN: 1000.0000 mg | Freq: Once | INTRAVENOUS | Status: AC | PRN

## 2021-09-03 MED ORDER — PRENATAL MULTIVITAMIN CH: 1.0000 | ORAL_TABLET | Freq: Every day | ORAL | Status: DC

## 2021-09-03 MED ORDER — DEXAMETHASONE SODIUM PHOSPHATE 10 MG/ML IJ SOLN
INTRAMUSCULAR | Status: DC | PRN
Start: 2021-09-03 — End: 2021-09-03
  Administered 2021-09-03: 10 mg via INTRAVENOUS

## 2021-09-03 MED ORDER — FENTANYL CITRATE (PF) 100 MCG/2ML IJ SOLN
INTRAMUSCULAR | Status: DC | PRN
  Administered 2021-09-03: 15 ug via INTRATHECAL

## 2021-09-03 MED ORDER — ONDANSETRON HCL 4 MG/2ML IJ SOLN
INTRAMUSCULAR | Status: AC
  Filled 2021-09-03: qty 2

## 2021-09-03 MED ORDER — MEPERIDINE HCL 25 MG/ML IJ SOLN: 6.2500 mg | INTRAMUSCULAR | Status: DC | PRN

## 2021-09-03 MED ORDER — ACETAMINOPHEN 500 MG PO TABS
1000.0000 mg | ORAL_TABLET | Freq: Four times a day (QID) | ORAL | Status: DC
  Administered 2021-09-04 – 2021-09-05 (×4): 1000 mg via ORAL
  Filled 2021-09-03 (×6): qty 2

## 2021-09-03 MED ORDER — COCONUT OIL OIL: 1.0000 "application " | TOPICAL_OIL | Status: DC | PRN

## 2021-09-03 MED ORDER — BUPIVACAINE HCL 0.5 % IJ SOLN
INTRAMUSCULAR | Status: DC | PRN
  Administered 2021-09-03: 11 mL

## 2021-09-03 MED ORDER — BUPIVACAINE IN DEXTROSE 0.75-8.25 % IT SOLN
INTRATHECAL | Status: DC | PRN
  Administered 2021-09-03: 1.6 mL via INTRATHECAL

## 2021-09-03 MED ORDER — SCOPOLAMINE 1 MG/3DAYS TD PT72
1.0000 | MEDICATED_PATCH | Freq: Once | TRANSDERMAL | Status: DC
  Administered 2021-09-03: 1.5 mg via TRANSDERMAL
  Filled 2021-09-03: qty 1

## 2021-09-03 MED ORDER — DIPHENHYDRAMINE HCL 50 MG/ML IJ SOLN
12.5000 mg | INTRAMUSCULAR | Status: DC | PRN
  Administered 2021-09-04: 12.5 mg via INTRAVENOUS
  Filled 2021-09-03: qty 1

## 2021-09-03 MED ORDER — OXYCODONE HCL 5 MG PO TABS: 5.0000 mg | ORAL_TABLET | ORAL | Status: DC | PRN

## 2021-09-03 MED ORDER — OXYTOCIN-SODIUM CHLORIDE 30-0.9 UT/500ML-% IV SOLN
INTRAVENOUS | Status: AC
Start: 2021-09-03 — End: ?
  Filled 2021-09-03: qty 500

## 2021-09-03 MED ORDER — MORPHINE SULFATE (PF) 0.5 MG/ML IJ SOLN
INTRAMUSCULAR | Status: AC
  Filled 2021-09-03: qty 10

## 2021-09-03 MED ORDER — ACETAMINOPHEN 10 MG/ML IV SOLN
INTRAVENOUS | Status: AC
Start: 2021-09-03 — End: ?
  Filled 2021-09-03: qty 100

## 2021-09-03 MED ORDER — ZOLPIDEM TARTRATE 5 MG PO TABS: 5.0000 mg | ORAL_TABLET | Freq: Every evening | ORAL | Status: DC | PRN

## 2021-09-03 MED ORDER — ONDANSETRON HCL 4 MG/2ML IJ SOLN: 4.0000 mg | Freq: Once | INTRAMUSCULAR | Status: DC | PRN

## 2021-09-03 MED ORDER — PHENYLEPHRINE HCL-NACL 20-0.9 MG/250ML-% IV SOLN
INTRAVENOUS | Status: DC | PRN
  Administered 2021-09-03: 60 ug/min via INTRAVENOUS

## 2021-09-03 MED ORDER — SODIUM CHLORIDE 0.9 % IV SOLN
300.0000 mg | INTRAVENOUS | Status: DC
  Administered 2021-09-03: 300 mg via INTRAVENOUS
  Filled 2021-09-03: qty 15

## 2021-09-03 MED ORDER — MORPHINE SULFATE (PF) 0.5 MG/ML IJ SOLN
INTRAMUSCULAR | Status: DC | PRN
  Administered 2021-09-03: .15 mg via INTRATHECAL

## 2021-09-03 MED ORDER — NALOXONE HCL 4 MG/10ML IJ SOLN: 1.0000 ug/kg/h | INTRAVENOUS | Status: DC | PRN

## 2021-09-03 MED ORDER — SIMETHICONE 80 MG PO CHEW
80.0000 mg | CHEWABLE_TABLET | Freq: Three times a day (TID) | ORAL | Status: DC
  Administered 2021-09-03 – 2021-09-05 (×6): 80 mg via ORAL
  Filled 2021-09-03 (×6): qty 1

## 2021-09-03 MED ORDER — TETANUS-DIPHTH-ACELL PERTUSSIS 5-2.5-18.5 LF-MCG/0.5 IM SUSY: 0.5000 mL | PREFILLED_SYRINGE | Freq: Once | INTRAMUSCULAR | Status: DC

## 2021-09-03 MED ORDER — DIPHENHYDRAMINE HCL 25 MG PO CAPS
25.0000 mg | ORAL_CAPSULE | Freq: Four times a day (QID) | ORAL | Status: DC | PRN
Start: 2021-09-03 — End: 2021-09-05

## 2021-09-03 MED ORDER — FENTANYL CITRATE (PF) 100 MCG/2ML IJ SOLN
INTRAMUSCULAR | Status: AC
  Filled 2021-09-03: qty 2

## 2021-09-03 MED ORDER — WITCH HAZEL-GLYCERIN EX PADS: 1.0000 "application " | MEDICATED_PAD | CUTANEOUS | Status: DC | PRN

## 2021-09-03 MED ORDER — DIPHENHYDRAMINE HCL 25 MG PO CAPS: 25.0000 mg | ORAL_CAPSULE | ORAL | Status: DC | PRN

## 2021-09-03 MED ORDER — IBUPROFEN 600 MG PO TABS
600.0000 mg | ORAL_TABLET | Freq: Four times a day (QID) | ORAL | Status: DC
  Administered 2021-09-04 – 2021-09-05 (×5): 600 mg via ORAL
  Filled 2021-09-03 (×5): qty 1

## 2021-09-03 MED ORDER — DEXAMETHASONE SODIUM PHOSPHATE 10 MG/ML IJ SOLN
INTRAMUSCULAR | Status: AC
  Filled 2021-09-03: qty 1

## 2021-09-03 MED ORDER — SOD CITRATE-CITRIC ACID 500-334 MG/5ML PO SOLN
30.0000 mL | ORAL | Status: AC
  Administered 2021-09-03: 30 mL via ORAL
  Filled 2021-09-03: qty 30

## 2021-09-03 MED ORDER — DIBUCAINE (PERIANAL) 1 % EX OINT: 1.0000 "application " | TOPICAL_OINTMENT | CUTANEOUS | Status: DC | PRN

## 2021-09-03 MED ORDER — ONDANSETRON HCL 4 MG/2ML IJ SOLN: 4.0000 mg | Freq: Three times a day (TID) | INTRAMUSCULAR | Status: DC | PRN

## 2021-09-03 MED ORDER — KETOROLAC TROMETHAMINE 30 MG/ML IJ SOLN
30.0000 mg | Freq: Four times a day (QID) | INTRAMUSCULAR | Status: AC
  Administered 2021-09-03 – 2021-09-04 (×3): 30 mg via INTRAVENOUS
  Filled 2021-09-03 (×3): qty 1

## 2021-09-03 MED ORDER — FERROUS SULFATE 325 (65 FE) MG PO TABS
325.0000 mg | ORAL_TABLET | Freq: Two times a day (BID) | ORAL | Status: DC
  Administered 2021-09-03 – 2021-09-05 (×4): 325 mg via ORAL
  Filled 2021-09-03 (×4): qty 1

## 2021-09-03 MED ORDER — SENNOSIDES-DOCUSATE SODIUM 8.6-50 MG PO TABS
2.0000 | ORAL_TABLET | ORAL | Status: DC
  Administered 2021-09-04: 2 via ORAL
  Filled 2021-09-03: qty 2

## 2021-09-03 MED ORDER — PHENYLEPHRINE HCL-NACL 20-0.9 MG/250ML-% IV SOLN
INTRAVENOUS | Status: AC
  Filled 2021-09-03: qty 250

## 2021-09-03 MED ORDER — ACETAMINOPHEN 10 MG/ML IV SOLN
INTRAVENOUS | Status: AC
  Filled 2021-09-03: qty 100

## 2021-09-03 MED ORDER — OXYTOCIN-SODIUM CHLORIDE 30-0.9 UT/500ML-% IV SOLN: 2.5000 [IU]/h | INTRAVENOUS | Status: AC

## 2021-09-03 MED ORDER — BUPIVACAINE HCL (PF) 0.5 % IJ SOLN
INTRAMUSCULAR | Status: AC
  Filled 2021-09-03: qty 30

## 2021-09-03 MED ORDER — SIMETHICONE 80 MG PO CHEW: 80.0000 mg | CHEWABLE_TABLET | ORAL | Status: DC | PRN

## 2021-09-03 MED ORDER — LACTATED RINGERS IV SOLN: INTRAVENOUS | Status: DC

## 2021-09-03 MED ORDER — ONDANSETRON HCL 4 MG/2ML IJ SOLN
INTRAMUSCULAR | Status: DC | PRN
  Administered 2021-09-03: 4 mg via INTRAVENOUS

## 2021-09-03 MED ORDER — GABAPENTIN 100 MG PO CAPS: 100.0000 mg | ORAL_CAPSULE | Freq: Three times a day (TID) | ORAL | Status: DC | PRN

## 2021-09-03 MED ORDER — ACETAMINOPHEN 325 MG PO TABS: 650.0000 mg | ORAL_TABLET | ORAL | Status: DC

## 2021-09-03 MED ORDER — LORATADINE 10 MG PO TABS: 10.0000 mg | ORAL_TABLET | ORAL | Status: DC

## 2021-09-03 MED ORDER — HYDROMORPHONE HCL 1 MG/ML IJ SOLN: 0.2500 mg | INTRAMUSCULAR | Status: DC | PRN

## 2021-09-03 SURGICAL SUPPLY — 37 items
APL SKNCLS STERI-STRIP NONHPOA (GAUZE/BANDAGES/DRESSINGS) ×1
BENZOIN TINCTURE PRP APPL 2/3 (GAUZE/BANDAGES/DRESSINGS) ×2 IMPLANT
CHLORAPREP W/TINT 26ML (MISCELLANEOUS) ×4 IMPLANT
CLAMP CORD UMBIL (MISCELLANEOUS) ×2 IMPLANT
CLOTH BEACON ORANGE TIMEOUT ST (SAFETY) ×2 IMPLANT
DRSG OPSITE POSTOP 4X10 (GAUZE/BANDAGES/DRESSINGS) ×2 IMPLANT
ELECT REM PT RETURN 9FT ADLT (ELECTROSURGICAL) ×2
ELECTRODE REM PT RTRN 9FT ADLT (ELECTROSURGICAL) ×1 IMPLANT
EXTRACTOR VACUUM KIWI (MISCELLANEOUS) IMPLANT
GLOVE BIO SURGEON STRL SZ 6.5 (GLOVE) ×2 IMPLANT
GLOVE BIOGEL PI IND STRL 7.0 (GLOVE) ×2 IMPLANT
GLOVE BIOGEL PI INDICATOR 7.0 (GLOVE) ×2
GOWN STRL REUS W/TWL LRG LVL3 (GOWN DISPOSABLE) ×6 IMPLANT
KIT ABG SYR 3ML LUER SLIP (SYRINGE) IMPLANT
MAT PREVALON FULL STRYKER (MISCELLANEOUS) ×1 IMPLANT
NDL HYPO 25X5/8 SAFETYGLIDE (NEEDLE) IMPLANT
NEEDLE HYPO 22GX1.5 SAFETY (NEEDLE) IMPLANT
NEEDLE HYPO 25X5/8 SAFETYGLIDE (NEEDLE) IMPLANT
NS IRRIG 1000ML POUR BTL (IV SOLUTION) ×2 IMPLANT
PACK C SECTION WH (CUSTOM PROCEDURE TRAY) ×2 IMPLANT
PAD OB MATERNITY 4.3X12.25 (PERSONAL CARE ITEMS) ×2 IMPLANT
RTRCTR C-SECT PINK 25CM LRG (MISCELLANEOUS) ×2 IMPLANT
STRIP CLOSURE SKIN 1/2X4 (GAUZE/BANDAGES/DRESSINGS) ×2 IMPLANT
SUT CHROMIC 2 0 CT 1 (SUTURE) ×4 IMPLANT
SUT MNCRL 0 VIOLET CTX 36 (SUTURE) ×2 IMPLANT
SUT MONOCRYL 0 CTX 36 (SUTURE) ×8
SUT PDS AB 0 CTX 60 (SUTURE) ×2 IMPLANT
SUT PLAIN 2 0 (SUTURE)
SUT PLAIN ABS 2-0 CT1 27XMFL (SUTURE) IMPLANT
SUT PROLENE 1 CT (SUTURE) ×1 IMPLANT
SUT VIC AB 0 CTX 36 (SUTURE) ×4
SUT VIC AB 0 CTX36XBRD ANBCTRL (SUTURE) ×2 IMPLANT
SUT VIC AB 4-0 KS 27 (SUTURE) ×2 IMPLANT
SYR CONTROL 10ML LL (SYRINGE) IMPLANT
TOWEL OR 17X24 6PK STRL BLUE (TOWEL DISPOSABLE) ×2 IMPLANT
TRAY FOLEY W/BAG SLVR 14FR LF (SET/KITS/TRAYS/PACK) ×2 IMPLANT
WATER STERILE IRR 1000ML POUR (IV SOLUTION) ×2 IMPLANT

## 2021-09-03 NOTE — Anesthesia Preprocedure Evaluation (Signed)
Anesthesia Evaluation  Patient identified by MRN, date of birth, ID band Patient awake    Reviewed: Allergy & Precautions, NPO status , Patient's Chart, lab work & pertinent test results  Airway Mallampati: III  TM Distance: >3 FB Neck ROM: Full    Dental no notable dental hx.    Pulmonary neg pulmonary ROS,    Pulmonary exam normal        Cardiovascular negative cardio ROS Normal cardiovascular exam     Neuro/Psych negative neurological ROS  negative psych ROS   GI/Hepatic Neg liver ROS, GERD  ,  Endo/Other  Morbid obesity  Renal/GU negative Renal ROS  negative genitourinary   Musculoskeletal negative musculoskeletal ROS (+)   Abdominal (+) + obese,   Peds  Hematology  (+) Blood dyscrasia, anemia ,   Anesthesia Other Findings   Reproductive/Obstetrics (+) Pregnancy                             Anesthesia Physical  Anesthesia Plan  ASA: III  Anesthesia Plan: Spinal   Post-op Pain Management:    Induction:   PONV Risk Score and Plan: 3 and Ondansetron, Dexamethasone and Scopolamine patch - Pre-op  Airway Management Planned: Natural Airway, Nasal Cannula and Simple Face Mask  Additional Equipment: None  Intra-op Plan:   Post-operative Plan:   Informed Consent: I have reviewed the patients History and Physical, chart, labs and discussed the procedure including the risks, benefits and alternatives for the proposed anesthesia with the patient or authorized representative who has indicated his/her understanding and acceptance.       Plan Discussed with: Anesthesiologist  Anesthesia Plan Comments:         Anesthesia Quick Evaluation

## 2021-09-03 NOTE — H&P (Signed)
REPEAT CESAREAN SECTION ADMISSION HISTORY & PHYSICAL - late entry  Admission Date: 09/02/2021  7:51 PM  Admit Diagnosis: History of previous Kerr incisions x 1; severe anemia of pregnancy   Ashley Pruitt is a 26 y.o. female G2P1001 [redacted]w[redacted]d presenting for preoperative admission for overnight blood transfusions. Patient with h/o severe iron deficient anemia. Hb on admission 6.9. Patient is scheduled for elective repeat cesarean section tomorrow 09/03/21.  Endorses active FM, denies LOF and vaginal bleeding.  Patient denies chest pain, shortness of breath etc.   History of current pregnancy: G2P1001   Prenatal Care with: [facility] Patient entered prenatal care at 12 wks.   EDC 09/10/21 by LMP8/21/22 and congruent w/ 12 wk U/S.   Anatomy scan:  20 wks, complete w/ fundal placenta.    Significant prenatal problems: Anemia of pregnancy Vitamin C deficiency Prior Cesarean section HSV 1 and HPV positive  Patient Active Problem List   Diagnosis Date Noted   Blood transfusion affecting pregnancy 09/02/2021   Goiter 03/18/2020   Status post primary low transverse cesarean section--NRFHR 02/26/2017   Acute blood loss anemia 02/26/2017    Prenatal Labs: ABO, Rh: --/--/A POS (05/20 2109) Antibody: NEG (05/20 2109) Rubella: Immune (12/16 0000)  RPR: NON REACTIVE (05/19 0907)  HBsAg: Negative (12/16 0000)  HIV: Non-reactive (12/16 0000)  GBS:    GC/CHL:  Genetics:  Vaccines: Tdap: Y/N/declined Flu          Y/N/declined Covid: Y/N/declined  Prenatal Transfer Tool  Maternal Diabetes: No Genetic Screening: Normal Maternal Ultrasounds/Referrals: Normal Fetal Ultrasounds or other Referrals:  None Maternal Substance Abuse:  No Significant Maternal Medications:  Meds include: Other: iron Significant Maternal Lab Results:  Group B Strep negative Other Comments:  None  OB History  Gravida Para Term Preterm AB Living  2 1 1     1   SAB IAB Ectopic Multiple Live Births          1    #  Outcome Date GA Lbr Len/2nd Weight Sex Delivery Anes PTL Lv  2 Current           1 Term 02/23/17 [redacted]w[redacted]d    CS-LTranv   LIV    Medical / Surgical History: Past medical history: History reviewed. No pertinent past medical history.  Past surgical history:  Past Surgical History:  Procedure Laterality Date   CESAREAN SECTION N/A 02/23/2017   Procedure: CESAREAN SECTION;  Surgeon: 13/01/2017, MD;  Location: [facility];  Service: Obstetrics;  Laterality: N/A;   Family History:  Family History  Problem Relation Age of Onset   Hypertension Mother    Hyperlipidemia Mother    Hypertension Father    Heart failure Father    Diabetes Maternal Grandmother    Hyperlipidemia Maternal Grandmother    Hypertension Maternal Grandmother    Hypertension Maternal Grandfather    Diabetes Paternal Grandmother    Hypertension Paternal Grandmother    Hypertension Paternal Grandfather    Heart failure Paternal Grandfather     Social History:  reports that she has never smoked. She has never used smokeless tobacco. She reports that she does not drink alcohol and does not use drugs.  Allergies: Patient has no known allergies.   Current Medications at time of admission:  Prior to Admission medications   Medication Sig Start Date End Date Taking? Authorizing Provider  iron polysaccharides (NIFEREX) 150 MG capsule Take 1 capsule (150 mg total) by mouth 2 (two) times daily. Patient not taking: Reported on 08/29/2021  05/09/21   Johney Maine, MD  Prenatal Vit-Fe Fumarate-FA (PRENATAL MULTIVITAMIN) TABS tablet Take 1 tablet by mouth daily after breakfast.     [provider]    Review of Systems: Constitutional: Negative   HENT: Negative   Eyes: Negative   Respiratory: Negative   Cardiovascular: Negative   Gastrointestinal: Negative  Genitourinary: neg for bloody show, neg for LOF   Musculoskeletal: Negative   Skin: Negative   Neurological: Negative   Endo/Heme/Allergies:  Negative   Psychiatric/Behavioral: Negative    Physical Exam: VS: Blood pressure (!) 102/54, pulse 91, temperature 97.7 F (36.5 C), temperature source Oral, resp. rate 20, height 5\' 2"  (1.575 m), weight 97.1 kg, SpO2 100 %, unknown if currently breastfeeding. AAO x3, no signs of distress Cardiovascular: RRR Respiratory: Lung fields clear to ausculation GU/GI: Abdomen gravid, non-tender, non-distended, active FM, vertex, EFW 3200 per Leopold's Extremities: neg edema, negative for pain, tenderness, and cords  Cervical exam:Deferred FHR: baseline rate 140 / variability moderate / accelerations present / absent decelerations TOCO: none      Assessment: 26 y.o. G2P1001 [redacted]w[redacted]d admitted for scheduled repeat cesarean section at term.   She was counseled that this surgery can be associated with risk and unforeseen complications.  These risks and complications of a cesarean section include, but are not limited to:   -Infection of the uterus, pelvic organs, or skin.  -inadvertent injury to internal organs, such as bowel or bladder. If there is major injury, extensive surgery   may be required. Bowel injury may require a colostomy for repair, bladder requiring prolonged catheterization, ureters requiring stent placement, or other pelvic/abdominal organs, the vessels or nerves.  -Blood loss possibly requiring a blood transfusion. Minor to moderate bleeding usually can be controlled.   Performing a cesarean hysterectomy for uncontrolled bleeding is rare.  -Blood clots can develop in the legs, pelvic organs, or lungs. Clotting can be life threatening.  Sequential compression devices (SCD's) are used during and after surgery to minimize the risk of clotting.  -Possible wound breakdown or less than satisfactory healing of scar.  The patient voiced understanding of the procedure and its attendant risks, she was given the opportunity to ask questions,  and her concerns were adequately addressed.    Plan:  2 Units of packed red blood cells transfused overnight w/o issue  Will use cell saver.  On call to OR Ancef 2 gM IV pre op Abdomen clipped Bicitra given Foley to be placed in OR  Consent signed witnessed and placed into chart.    [redacted]w[redacted]d MD 09/03/2021 9:11 AM

## 2021-09-03 NOTE — Transfer of Care (Signed)
Immediate Anesthesia Transfer of Care Note  Patient: Ashley Pruitt  Procedure(s) Performed: CESAREAN SECTION APPLICATION OF CELL SAVER  Patient Location: PACU  Anesthesia Type:Spinal  Level of Consciousness: awake, alert  and oriented  Airway & Oxygen Therapy: Patient Spontanous Breathing  Post-op Assessment: Report given to RN and Post -op Vital signs reviewed and stable  Post vital signs: Reviewed and stable  Last Vitals:  Vitals Value Taken Time  BP 122/79 09/03/21 1052  Temp    Pulse 75 09/03/21 1058  Resp 19 09/03/21 1058  SpO2 97 % 09/03/21 1058  Vitals shown include unvalidated device data.  Last Pain:  Vitals:   09/03/21 0800  TempSrc:   PainSc: 0-No pain         Complications: No notable events documented.

## 2021-09-03 NOTE — Op Note (Signed)
Cesarean Section Procedure Note  Indications: previous uterine incision kerr x 1  Pre-operative Diagnosis: 39 week 0day pregnancy, prior Low transverse cesarean section  Post-operative Diagnosis: same  Surgeon: Wynonia Hazard, MD  Assistants: NONE  Anesthesia: Spinal anesthesia  ASA Class: 2  Procedure Details   The patient was counseled about the risks, benefits, complications of the cesarean section. The patient concurred with the proposed plan, giving informed consent.  The site of surgery properly noted/marked. The patient was taken to Operating Room A identified as Keyleen S Rowlette and the procedure verified as C-Section Delivery. A Time Out was held and the above information confirmed.  After spinal was found to adequate , the patient was placed in the dorsal supine position with a leftward tilt, draped and prepped in the usual sterile manner. A Pfannenstiel incision was made and carried down through the subcutaneous tissue to the fascia.  The fascia was incised in the midline and the fascial incision was extended laterally with Mayo scissors. The superior aspect of the fascial incision was grasped with Coker clamps x2, tented up and the rectus muscles dissected off sharply with the scalpel. The rectus was then dissected off with blunt dissection and Mayo scissors inferiorly. The rectus muscles were separated in the midline. The abdominal peritoneum was identified, tented up between two hemostats, entered sharply using Metzenbaum scissors, and the incision was extended superiorly and inferiorly with good visualization of the bladder. The Alexis retractor was then deployed. The vesicouterine peritoneum was identified, tented up, entered sharply with Metzenbaum scissors, and the bladder flap was created digitally. Scalpel was then used to make a low transverse incision on the uterus which was extended laterally with  blunt dissection. The fetal vertex was identified, the head was brought  out from the pelvis. A Kiwi vacuum was placed to aid in delivery of the vertex.  The head was then delivered easily through the uterine incision followed by the body. The A live female infant was bulb suctioned on the operative field cried vigorously, cord was clamped and cut and the infant was passed to the waiting neonatologist. Apgars 9/10. Placenta was then delivered spontaneously, intact and appear normal, the uterus was cleared of all clot and debris. The uterine incision was repaired with #0 Monocryl in running locked fashion. A second imbricating suture was performed using the same suture. The incision was hemostatic. Ovaries and tubes were inspected and normal. The Alexis retractor was removed. The abdominal cavity was cleared of all clot and debris. The abdominal peritoneum was reapproximated with 2-0 chromic  in a running fashion, the rectus muscles was reapproximated with #2 chromic in interrupted fashion. The fascia was closed with 0 PDS in a running fashion. The subcuticular layer was irrigated and all bleeders cauterized.    The incision was injected with 30 mL of 0.5% Marcaine. Interrupted sutures of 2-0 plain were used to re-approximate Scarpas fascia.  The skin was closed with 4-0 vicryl in a subcuticular fashion using a Mellody Dance needle. The incision was dressed with benzoine, steri strips and pressure dressing. All sponge lap and needle counts were correct x3. Patient tolerated the procedure well and recovered in stable condition following the procedure.  Instrument, sponge, and needle counts were correct prior the abdominal closure and at the conclusion of the case.   Findings: Live female infant "C[place]ron" Weight 3300 grams (7# 4.4oz) Apgars 9/10 clear amniotic fluid, normal appearing placenta- 3 vessel cord normal uterus, bilateral tubes and ovaries  Estimated Blood Loss: 270  mL  IVF: 1500 mL         Drains: Foley catheter  Urine output: 50 mL clear urine         Specimens: Placenta  to L&D         Implants: none         Complications:  None; patient tolerated the procedure well.         Disposition: PACU - hemodynamically stable.   Naoma Diener Shoua Ulloa  09/03/2021

## 2021-09-03 NOTE — Lactation Note (Signed)
This note was copied from a baby's chart. Lactation Consultation Note  Patient Name: Ashley Pruitt [id number] Date: 09/03/2021   Age:27 hours  Lactation Note:  Per RN, mother is formula feeding only.   Maternal Data    Feeding Nipple Type: Slow - flow  LATCH Score                    Lactation Tools Discussed/Used    Interventions    Discharge    Consult Status      Dallas Torok R Rio Kidane 09/03/2021, 3:32 PM

## 2021-09-03 NOTE — Anesthesia Procedure Notes (Signed)
Spinal  Patient location during procedure: OR Start time: 09/03/2021 9:13 AM End time: 09/03/2021 9:15 AM Reason for block: surgical anesthesia Staffing Performed: anesthesiologist  Anesthesiologist: Leilani Able, MD Preanesthetic Checklist Completed: patient identified, IV checked, site marked, risks and benefits discussed, surgical consent, monitors and equipment checked, pre-op evaluation and timeout performed Spinal Block Patient position: sitting Prep: DuraPrep and site prepped and draped Patient monitoring: continuous pulse ox and blood pressure Approach: midline Location: L3-4 Injection technique: single-shot Needle Needle type: Pencan  Needle gauge: 24 G Needle length: 10 cm Needle insertion depth: 6 cm Assessment Sensory level: T4 Events: CSF return

## 2021-09-03 NOTE — Progress Notes (Signed)
MD Preoperative Note  Procedure:  Repeat   cesarean section   Preoperative Diagnosis: previous Kerr incision x 1  Counseling:   Patient counseled that a cesarean section is a major surgery to deliver the baby through an incision in the abdominal wall and uterus.  She was counseled that this can be associated with risk and unforeseen complications.  These risks and complications of a cesarean section include, but are not limited to:   -Infection of the uterus, pelvic organs, or skin. Infection is the most common complication of a Cesarean  section.  -inadvertent injury to internal organs, such as bowel or bladder. If there is major injury, extensive surgery      may be required. Bowel injury may require a colostomy for repair, bladder requiring prolonged catheterization, ureters requiring stent placement, or other pelvic/abdominal organs, the vessels or nerves.  -Blood loss possibly requiring a blood transfusion. Minor to moderate bleeding usually can be controlled.     Performing a hysterectomy for uncontrolled bleeding is rare.  -Blood clots can develop in the legs, pelvic organs, or lungs. Clotting can be life threatening. Sequential      compression devices, SCD's, are used during and after surgery to minimize the risk of clotting. W -Possible wound breakdown or less than satisfactory healing of scar. -An increased risk of a Cesarean section in subsequent pregnancies.  -Accidental laceration of the baby while making the incision in the uterus  The patient voiced understanding of the procedure and its attendant risks, and gives informed consent to proceed.   Consent signed, witnessed and placed into chart The patient was given the opportunity to ask questions, and her concerns were adequately addressed.   Plan:  On call to OR Ancef 2 gM IV pre op Abdomen clipped Bicitra given Foley to be placed in OR (foley already in place)  Essie Hart MD 9:16 AM

## 2021-09-03 NOTE — Anesthesia Postprocedure Evaluation (Signed)
Anesthesia Post Note  Patient: [name]  Procedure(s) Performed: CESAREAN SECTION APPLICATION OF CELL SAVER     Patient location during evaluation: PACU Anesthesia Type: Spinal Level of consciousness: awake Pain management: pain level controlled Vital Signs Assessment: post-procedure vital signs reviewed and stable Respiratory status: spontaneous breathing Cardiovascular status: stable Postop Assessment: no headache, no backache, spinal receding, patient able to bend at knees and no apparent nausea or vomiting Anesthetic complications: no   No notable events documented.  Last Vitals:  Vitals:   09/03/21 1116 09/03/21 1117  BP:  113/84  Pulse: 72 68  Resp: 19 15  Temp:    SpO2: 98% 99%    Last Pain:  Vitals:   09/03/21 1054  TempSrc:   PainSc: 3    Pain Goal:    LLE Motor Response: No movement due to regional block (09/03/21 1115) LLE Sensation: Numbness (09/03/21 1115) RLE Motor Response: No movement due to regional block (09/03/21 1115) RLE Sensation: Numbness (09/03/21 1115)     Epidural/Spinal Function Cutaneous sensation: Vague (09/03/21 1115), Patient able to flex knees: No (09/03/21 1115), Patient able to lift hips off bed: No (09/03/21 1115), Back pain beyond tenderness at insertion site: No (09/03/21 1115), Progressively worsening motor and/or sensory loss: No (09/03/21 1115), Bowel and/or bladder incontinence post epidural: No (09/03/21 1115)  Caren Macadam

## 2021-09-04 ENCOUNTER — Encounter (HOSPITAL_COMMUNITY): Payer: Self-pay | Admitting: Obstetrics & Gynecology

## 2021-09-04 LAB — TYPE AND SCREEN
ABO/RH(D): A POS
Antibody Screen: NEGATIVE
Unit division: 0
Unit division: 0

## 2021-09-04 LAB — CBC
HCT: 26.6 % — ABNORMAL LOW (ref 36.0–46.0)
Hemoglobin: 8.4 g/dL — ABNORMAL LOW (ref 12.0–15.0)
MCH: 21.2 pg — ABNORMAL LOW (ref 26.0–34.0)
MCHC: 31.6 g/dL (ref 30.0–36.0)
MCV: 67.2 fL — ABNORMAL LOW (ref 80.0–100.0)
Platelets: 208 10*3/uL (ref 150–400)
RBC: 3.96 MIL/uL (ref 3.87–5.11)
RDW: 23.7 % — ABNORMAL HIGH (ref 11.5–15.5)
WBC: 13.7 10*3/uL — ABNORMAL HIGH (ref 4.0–10.5)
nRBC: 0.6 % — ABNORMAL HIGH (ref 0.0–0.2)

## 2021-09-04 LAB — BPAM RBC
Blood Product Expiration Date: 202306132359
Blood Product Expiration Date: 202306132359
ISSUE DATE / TIME: 202305202249
ISSUE DATE / TIME: 202305210156
Unit Type and Rh: 6200
Unit Type and Rh: 6200

## 2021-09-04 NOTE — Progress Notes (Signed)
Subjective: Postpartum Day 1: Cesarean Delivery Patient reports she is tolerating a regular diet. She denies nausea or vomiting. She is ambulating and voiding without difficulty. Her incisional pain is well controlled.   Objective: Vital signs in last 24 hours: Temp:  [97.8 F (36.6 C)-98.3 F (36.8 C)] 98.3 F (36.8 C) (05/22 0540) Pulse Rate:  [57-90] 73 (05/22 0540) Resp:  [9-28] 18 (05/22 0540) BP: (103-132)/(60-90) 109/60 (05/22 0540) SpO2:  [96 %-100 %] 99 % (05/22 0540)  Physical Exam:  General: alert, cooperative, and no distress Lochia: appropriate Uterine Fundus: firm Incision: no significant drainage DVT Evaluation: No evidence of DVT seen on physical exam.  Recent Labs    09/03/21 0432 09/04/21 0526  HGB 9.3* 8.4*  HCT 31.4* 26.6*   CBC    Component Value Date/Time   WBC 13.7 (H) 09/04/2021 0526   RBC 3.96 09/04/2021 0526   HGB 8.4 (L) 09/04/2021 0526   HCT 26.6 (L) 09/04/2021 0526   PLT 208 09/04/2021 0526   MCV 67.2 (L) 09/04/2021 0526   MCH 21.2 (L) 09/04/2021 0526   MCHC 31.6 09/04/2021 0526   RDW 23.7 (H) 09/04/2021 0526   LYMPHSABS 2.1 05/02/2021 1408   MONOABS 0.6 05/02/2021 1408   EOSABS 0.1 05/02/2021 1408   BASOSABS 0.0 05/02/2021 1408    Assessment/Plan: 25 y/o G2P2002 POD # 1 Status post repeat Cesarean section, complicated by pre and post-op anemia and she is s/p 2 Units PRBC blood transfusion and IV venofer on 09/03/21, Doing well postoperatively.  Continue current care and plan for continued oral iron supplements  upon discharge.  Prescilla Sours, MD.  09/04/2021, 8:18 AM

## 2021-09-05 ENCOUNTER — Other Ambulatory Visit (HOSPITAL_COMMUNITY): Payer: Self-pay

## 2021-09-05 MED ORDER — IBUPROFEN 600 MG PO TABS
600.0000 mg | ORAL_TABLET | Freq: Four times a day (QID) | ORAL | 0 refills | Status: DC
  Filled 2021-09-05: qty 30, 8d supply, fill #0

## 2021-09-05 MED ORDER — OXYCODONE HCL 5 MG PO TABS
5.0000 mg | ORAL_TABLET | ORAL | 0 refills | Status: DC | PRN
  Filled 2021-09-05: qty 30, 3d supply, fill #0

## 2021-09-05 MED ORDER — POLY-IRON 150 FORTE 150-25-1 MG-MCG-MG PO CAPS
150.0000 mg | ORAL_CAPSULE | Freq: Every day | ORAL | 4 refills | Status: AC
Start: 2021-09-05 — End: ?
  Filled 2021-09-05: qty 30, 30d supply, fill #0

## 2021-09-05 NOTE — Discharge Summary (Signed)
Physician Discharge Summary  Patient ID: Ashley Pruitt MRN: [id number] DOB/AGE: 1995-07-17 26 y.o.  Admit date: 09/02/2021 Discharge date: 09/05/2021  Admission Diagnoses: One prior cesarean section and desiring a repeat cesarean delivery. 38 week 6 Day EGA intrauterine pregnancy.  Anemia.   Discharge Diagnoses:  Principal Problem:   S/P repeat low transverse C-section Active Problems:   Blood transfusion affecting pregnancy   Discharged Condition: good  Hospital Course: 26 y/o G2P1001 at 60 W 6 D EGA who presented for a pre-op blood transfusion for anemia and then a repeat cesarean the next day.  Her surgery was uncomplicated and she received IV iron on POD # 0.  She did well post-operatively, was tolerating a regular diet, ambulating and voiding without difficulty.  Her lochia was appropriate. She was deemed stable for discharge by POD # 2.   Consults: None  Significant Diagnostic Studies: labs:     Latest Ref Rng & Units 09/04/2021    5:26 AM 09/03/2021    4:32 AM 09/02/2021    9:09 PM  CBC  WBC 4.0 - 10.5 K/uL 13.7   7.5   5.8    Hemoglobin 12.0 - 15.0 g/dL 8.4   9.3   6.9    Hematocrit 36.0 - 46.0 % 26.6   31.4   23.1    Platelets 150 - 400 K/uL 208   202   205       Treatments: 2 Units PRBC, IV iron infusion (venofer).   Discharge Exam: Blood pressure 118/77, pulse 95, temperature 98.2 F (36.8 C), resp. rate 18, height 5\' 2"  (1.575 m), weight 97.1 kg, SpO2 99 %, unknown if currently breastfeeding. General appearance: alert, cooperative, and no distress GI: soft, non-tender; bowel sounds normal; no masses,  no organomegaly Pelvic: deferred. Small lochia on peripad.  Extremities: extremities normal, atraumatic, no cyanosis or edema  Disposition: Home.   Allergies as of 09/05/2021   No Known Allergies      Medication List     TAKE these medications    ibuprofen 600 MG tablet Commonly known as: ADVIL Take 1 tablet (600 mg total) by mouth every 6 (six)  hours.   iron polysaccharides 150 MG capsule Commonly known as: NIFEREX Take 1 capsule (150 mg total) by mouth daily. What changed: when to take this   oxyCODONE 5 MG immediate release tablet Commonly known as: Oxy IR/ROXICODONE Take 1-2 tablets (5-10 mg total) by mouth every 4 (four) hours as needed for moderate pain.   prenatal multivitamin Tabs tablet Take 1 tablet by mouth daily after breakfast.        Follow-up Information     Ob/Gyn, [place]. Schedule an appointment as soon as possible for a visit in 1 week(s).   Specialty: Obstetrics and Gynecology Why: Mood and incision check. Contact information: [address]. [address] [address] [phone]         [facility]. Schedule an appointment as soon as possible for a visit in 6 week(s).   Specialty: Obstetrics and Gynecology Why: Postpartum check Contact information: [address]. [address] [phone]                Signed: Archie Endo, MD. 09/05/2021, 2:17 PM

## 2021-09-06 MED FILL — Sodium Chloride IV Soln 0.9%: INTRAVENOUS | Qty: 1000 | Status: AC

## 2021-09-06 MED FILL — Heparin Sodium (Porcine) Inj 1000 Unit/ML: INTRAMUSCULAR | Qty: 30 | Status: AC

## 2021-09-12 ENCOUNTER — Telehealth (HOSPITAL_COMMUNITY): Payer: Self-pay | Admitting: *Deleted

## 2021-09-12 NOTE — Telephone Encounter (Signed)
Attempted hospital discharge follow-up call. Left message for patient to return RN call. Deforest Hoyles, RN, 09/12/21, Mikle Bosworth

## 2021-09-14 DIAGNOSIS — D649 Anemia, unspecified: Secondary | ICD-10-CM | POA: Diagnosis not present

## 2021-09-15 DIAGNOSIS — R69 Illness, unspecified: Secondary | ICD-10-CM | POA: Diagnosis not present

## 2021-10-16 DIAGNOSIS — Z308 Encounter for other contraceptive management: Secondary | ICD-10-CM | POA: Diagnosis not present

## 2021-10-16 DIAGNOSIS — Z8742 Personal history of other diseases of the female genital tract: Secondary | ICD-10-CM | POA: Diagnosis not present

## 2021-10-16 DIAGNOSIS — R69 Illness, unspecified: Secondary | ICD-10-CM | POA: Diagnosis not present

## 2021-10-16 DIAGNOSIS — R87619 Unspecified abnormal cytological findings in specimens from cervix uteri: Secondary | ICD-10-CM | POA: Diagnosis not present

## 2021-10-18 DIAGNOSIS — Z3042 Encounter for surveillance of injectable contraceptive: Secondary | ICD-10-CM | POA: Diagnosis not present

## 2022-08-09 IMAGING — US US THYROID
1 series · 14 of 25 positions shown · non-contrast
Comparison: None.

CLINICAL DATA: Palpable abnormality.

EXAM:
THYROID ULTRASOUND
TECHNIQUE: Ultrasound examination of the thyroid gland and adjacent soft
tissues was performed.

[Series 1: us thyroid · 0.07mm/px · 14 of 36 slices shown]
[im 1/36]
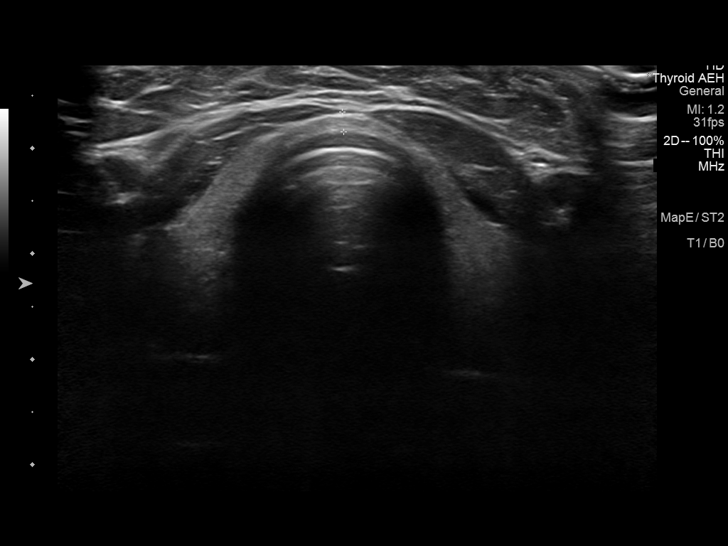
[im 3/36]
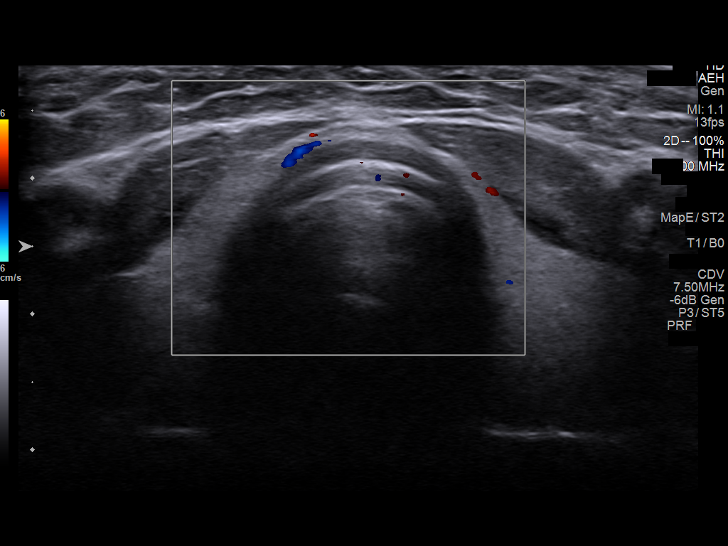
[im 6/36]
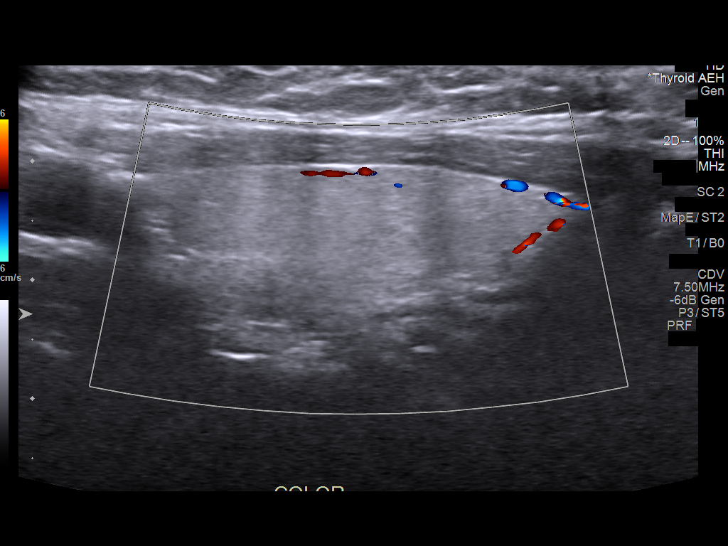
[im 9/36]
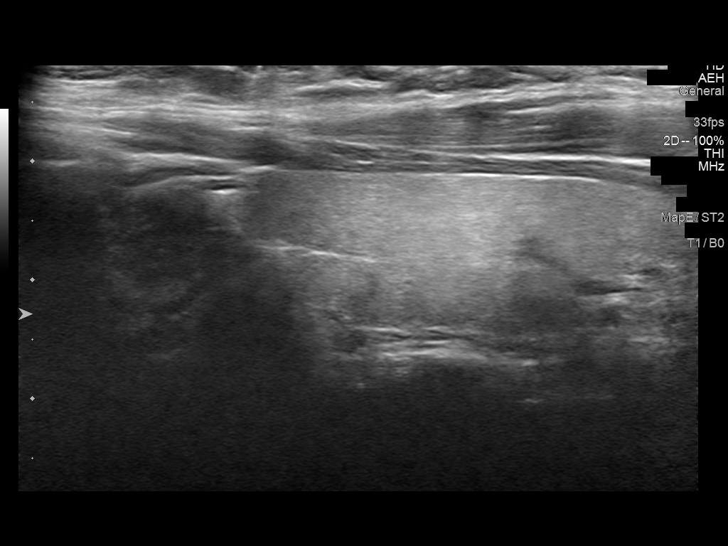
[im 12/36]
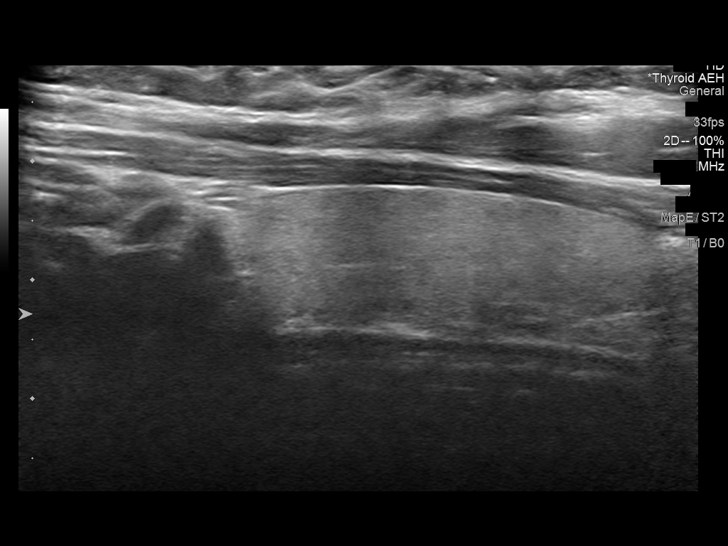
[im 14/36]
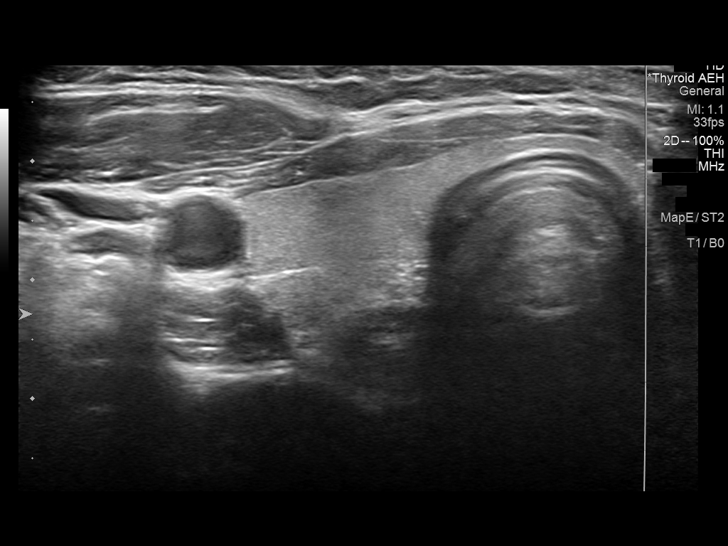
[im 17/36]
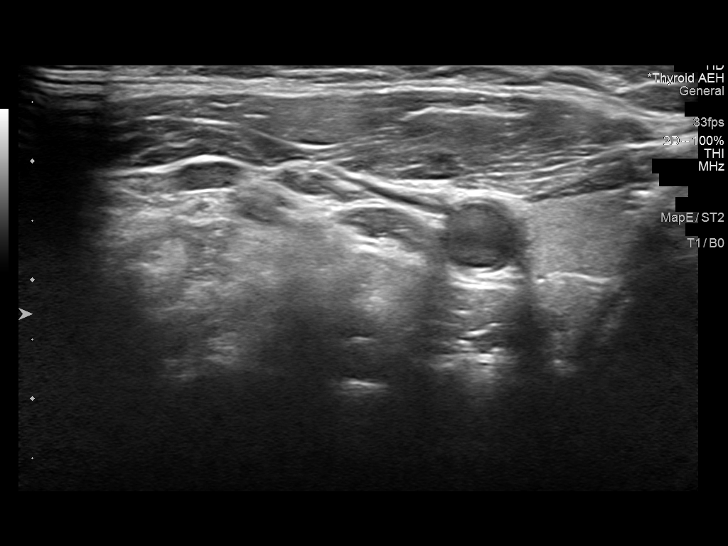
[im 19/36]
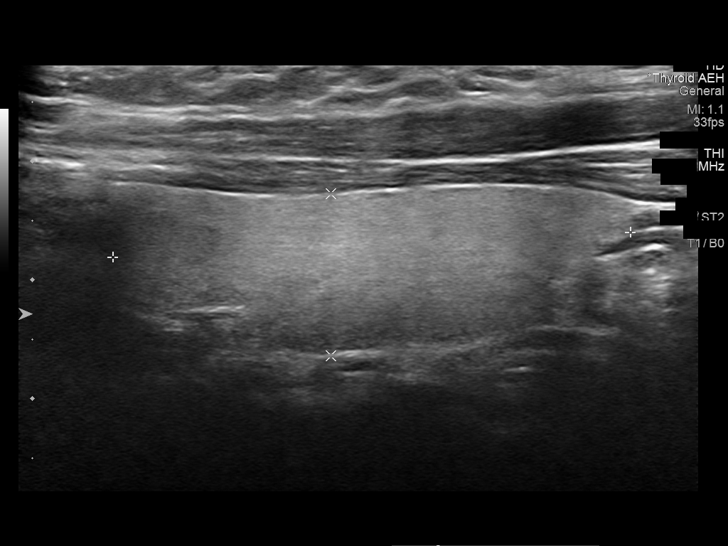
[im 22/36]
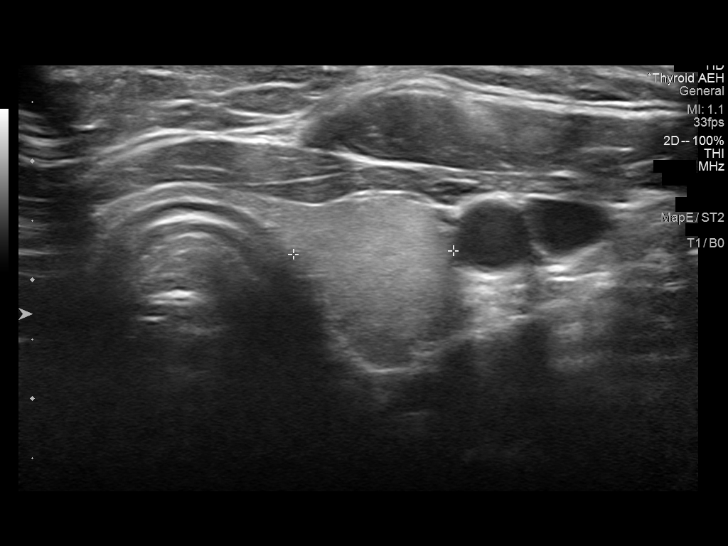
[im 24/36]
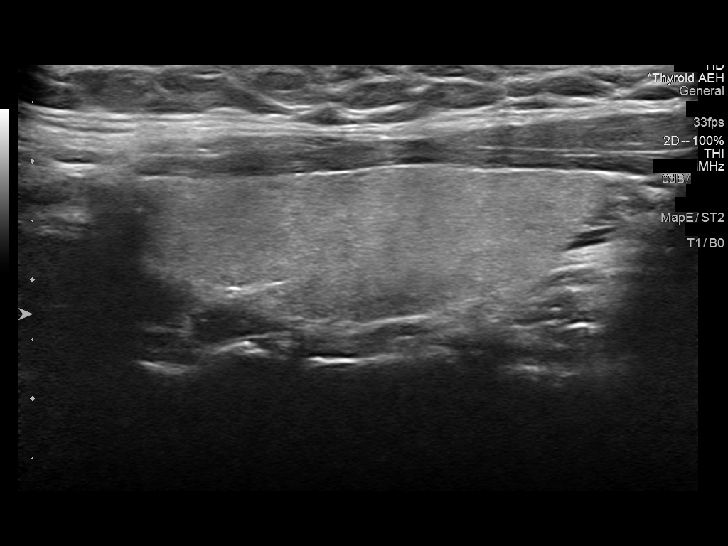
[im 27/36]
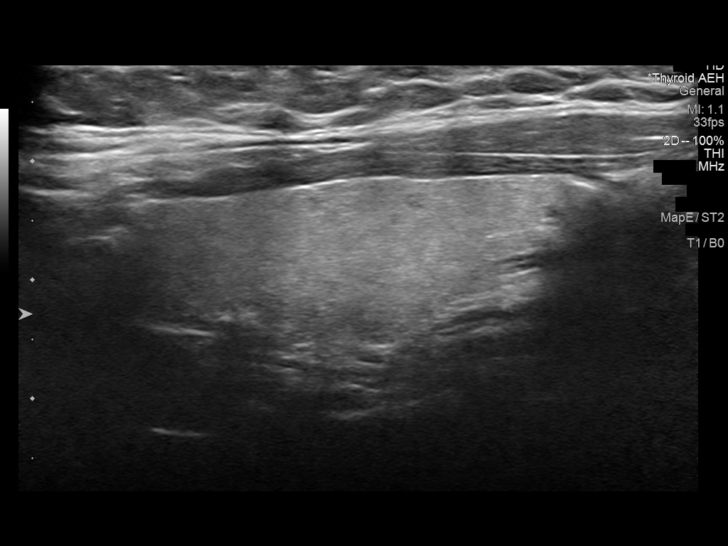
[im 30/36]
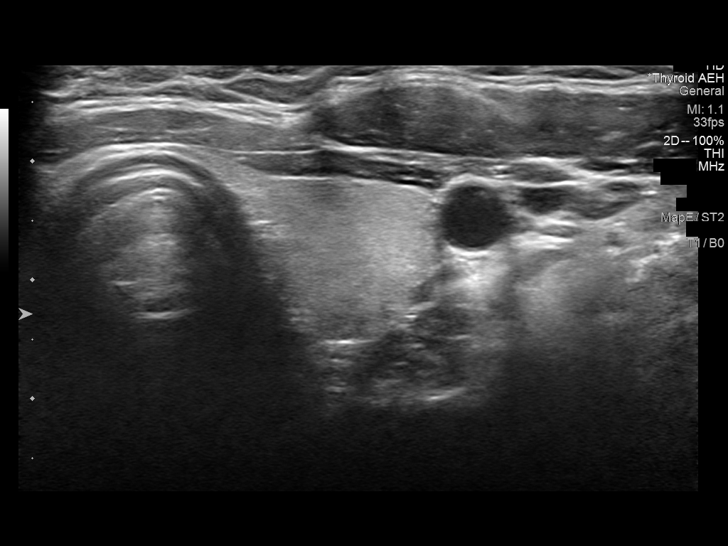
[im 33/36]
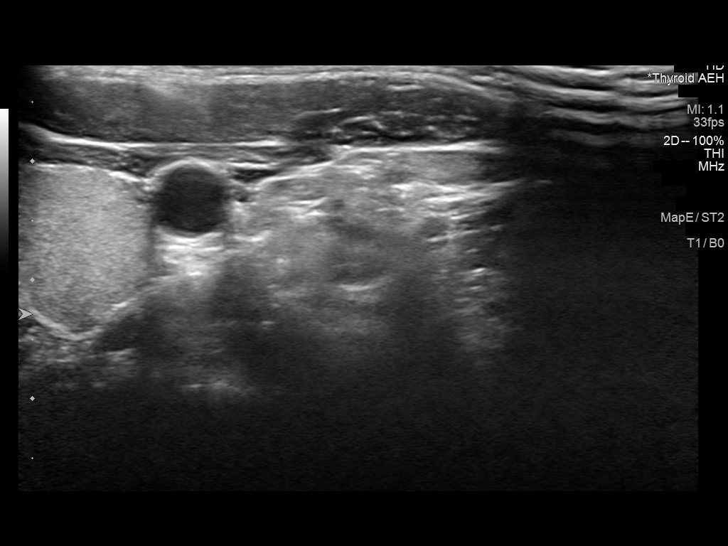
[im 36/36]
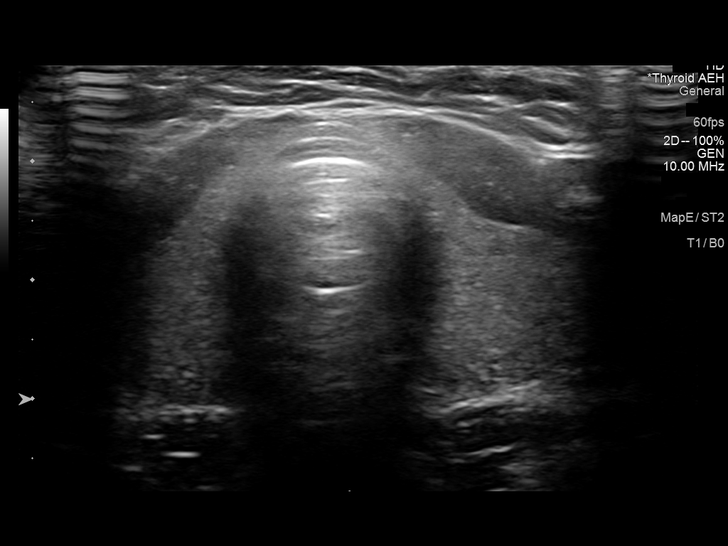

[14 of 25 positions shown; findings below may reference images not displayed]

FINDINGS: Parenchymal Echotexture: Normal

Isthmus: 0.2 cm

Right lobe: 4.3 x 1.4 x 1.7 cm

Left lobe: 4.4 x 1.4 x 1.4 cm

_________________________________________________________

Estimated total number of nodules >/= 1 cm: 0

Number of spongiform nodules >/=  2 cm not described below (TR1): 0

Number of mixed cystic and solid nodules >/= 1.5 cm not described
below (TR2): 0

_________________________________________________________

No discrete nodules are seen within the thyroid gland.
IMPRESSION: Normal sonographic appearance of the thyroid gland.

## 2023-06-14 ENCOUNTER — Other Ambulatory Visit (HOSPITAL_COMMUNITY): Payer: Self-pay

## 2023-07-05 ENCOUNTER — Ambulatory Visit (HOSPITAL_COMMUNITY)

## 2023-11-21 ENCOUNTER — Encounter (HOSPITAL_COMMUNITY): Payer: Self-pay | Admitting: *Deleted

## 2023-11-21 ENCOUNTER — Ambulatory Visit (HOSPITAL_COMMUNITY)
Admission: EM | Admit: 2023-11-21 | Discharge: 2023-11-21 | Disposition: A | Discharge status: Home / Self Care | Attending: Family Medicine | Admitting: Family Medicine

## 2023-11-21 DIAGNOSIS — E010 Iodine-deficiency related diffuse (endemic) goiter: Secondary | ICD-10-CM | POA: Diagnosis present

## 2023-11-21 DIAGNOSIS — R519 Headache, unspecified: Secondary | ICD-10-CM | POA: Insufficient documentation

## 2023-11-21 DIAGNOSIS — R42 Dizziness and giddiness: Secondary | ICD-10-CM | POA: Diagnosis not present

## 2023-11-21 LAB — BASIC METABOLIC PANEL WITH GFR
Anion gap: 11 (ref 5–15)
BUN: 10 mg/dL (ref 6–20)
CO2: 21 mmol/L — ABNORMAL LOW (ref 22–32)
Calcium: 9.1 mg/dL (ref 8.9–10.3)
Chloride: 107 mmol/L (ref 98–111)
Creatinine, Ser: 0.66 mg/dL (ref 0.44–1.00)
GFR, Estimated: >60 mL/min (ref >60)
Glucose, Bld: 70 mg/dL (ref 70–99)
Potassium: 4.1 mmol/L (ref 3.5–5.1)
Sodium: 139 mmol/L (ref 135–145)

## 2023-11-21 LAB — CBC
HCT: 34.7 % — ABNORMAL LOW (ref 36.0–46.0)
Hemoglobin: 10.5 g/dL — ABNORMAL LOW (ref 12.0–15.0)
MCH: 20.9 pg — ABNORMAL LOW (ref 26.0–34.0)
MCHC: 30.3 g/dL (ref 30.0–36.0)
MCV: 69.1 fL — ABNORMAL LOW (ref 80.0–100.0)
Platelets: 318 K/uL (ref 150–400)
RBC: 5.02 MIL/uL (ref 3.87–5.11)
RDW: 18.8 % — ABNORMAL HIGH (ref 11.5–15.5)
WBC: 5.4 K/uL (ref 4.0–10.5)
nRBC: 0 % (ref 0.0–0.2)

## 2023-11-21 LAB — TSH: TSH: 1.039 u[IU]/mL (ref 0.350–4.500)

## 2023-11-21 MED ORDER — MECLIZINE HCL 25 MG PO TABS: 25.0000 mg | ORAL_TABLET | Freq: Three times a day (TID) | ORAL | 0 refills | Status: AC | PRN

## 2023-11-21 NOTE — ED Triage Notes (Signed)
 Pt states she has headaches and dizziness that comes and goes X 2-3 weeks. She can't relate them to anything. She is taking tylenol  if the headache gets too bad. She is currently having some dizziness but no headache.

## 2023-11-21 NOTE — Discharge Instructions (Signed)
 Take meclizine  25 mg--1 tablet every 8 hours as needed for vertigo  We have drawn blood to check for platelet counts, electrolytes and sugar, and thyroid  function.  Staff will notify you of the anything is significantly abnormal  You can use the QR code/website at the back of the summary paperwork to schedule yourself a new patient appointment with primary care

## 2023-11-21 NOTE — ED Provider Notes (Signed)
 [facility]    CSN: [id number] Arrival date & time: 11/21/23  0910      History   Chief Complaint Chief Complaint  Patient presents with   Headache   Dizziness    HPI Ashley Pruitt is a 28 y.o. female.    Headache Associated symptoms: dizziness   Dizziness Associated symptoms: headaches   Here for intermittent headache and vertigo.  First noted the symptoms about 2 or 3 weeks ago.  She will have a frontal headache that he rates at about 5 out of 10.  It will last a few minutes usually; sometimes it is lasted longer and Tylenol  makes it go away.  She also will have vertigo that lasts a couple of seconds.  Sometimes she is just sitting at her computer when this happens but also she has noted it some when rolling over in the bed or on lateral gaze.  No fever or chills or upper respiratory symptoms.  She has maybe occasionally been nauseated with vertigo, but no vomiting or diarrhea.  Last menstrual cycle was July 26 through August 1.  She has an IUD.  NKDA  She does have a history of severe anemia.  Periods are lighter now that she has IUD.    History reviewed. No pertinent past medical history.  Patient Active Problem List   Diagnosis Date Noted   S/P repeat low transverse C-section 09/03/2021   Blood transfusion affecting pregnancy 09/02/2021   Goiter 03/18/2020   Status post primary low transverse cesarean section--[facility] 02/26/2017   Acute blood loss anemia 02/26/2017    Past Surgical History:  Procedure Laterality Date   CESAREAN SECTION N/A 02/23/2017   Procedure: CESAREAN SECTION;  Surgeon: Armond Cape, MD;  Location: [facility];  Service: Obstetrics;  Laterality: N/A;   CESAREAN SECTION N/A 09/03/2021   Procedure: CESAREAN SECTION;  Surgeon: Bettina Muskrat, MD;  Location: [facility];  Service: Obstetrics;  Laterality: N/A;    OB History     Gravida  2   Para  2   Term  2   Preterm      AB      Living  2      SAB       IAB      Ectopic      Multiple  0   Live Births  2            Home Medications    Prior to Admission medications   Medication Sig Start Date End Date Taking? Authorizing Provider  meclizine  (ANTIVERT ) 25 MG tablet Take 1 tablet (25 mg total) by mouth 3 (three) times daily as needed for dizziness (vertigo). 11/21/23  Yes Emanuell Morina, Sharlet POUR, MD  Iron  Polysacch Cmplx-B12-FA (POLY-IRON  150 FORTE) 150-0.025-1 MG CAPS Take 150 mg by mouth daily. 09/05/21   Gloriann Chick, MD  Prenatal Vit-Fe Fumarate-FA (PRENATAL MULTIVITAMIN) TABS tablet Take 1 tablet by mouth daily after breakfast.     [provider]  iron  polysaccharides (NIFEREX) 150 MG capsule Take 1 capsule (150 mg total) by mouth 2 (two) times daily. Patient not taking: Reported on 08/29/2021 05/09/21 09/05/21  Onesimo Emaline Brink, MD    Family History Family History  Problem Relation Age of Onset   Hypertension Mother    Hyperlipidemia Mother    Hypertension Father    Heart failure Father    Diabetes Maternal Grandmother    Hyperlipidemia Maternal Grandmother    Hypertension Maternal Grandmother    Hypertension  Maternal Grandfather    Diabetes Paternal Grandmother    Hypertension Paternal Grandmother    Hypertension Paternal Grandfather    Heart failure Paternal Grandfather     Social History Social History   Tobacco Use   Smoking status: Never   Smokeless tobacco: Never  Vaping Use   Vaping status: Never Used  Substance Use Topics   Alcohol use: No    Comment: socially   Drug use: No     Allergies   Patient has no known allergies.   Review of Systems Review of Systems  Neurological:  Positive for dizziness and headaches.     Physical Exam Triage Vital Signs ED Triage Vitals  Encounter Vitals Group     BP 11/21/23 0928 118/83     Girls Systolic BP Percentile --      Girls Diastolic BP Percentile --      Boys Systolic BP Percentile --      Boys Diastolic BP Percentile --      Pulse Rate  11/21/23 0928 76     Resp 11/21/23 0928 18     Temp 11/21/23 0928 98.2 F (36.8 C)     Temp Source 11/21/23 0928 Oral     SpO2 11/21/23 0928 98 %     Weight --      Height --      Head Circumference --      Peak Flow --      Pain Score 11/21/23 0927 0     Pain Loc --      Pain Education --      Exclude from Growth Chart --    No data found.  Updated Vital Signs BP 118/83 (BP Location: Right Arm)   Pulse 76   Temp 98.2 F (36.8 C) (Oral)   Resp 18   LMP 11/05/2023 (Approximate)   SpO2 98%   Visual Acuity Right Eye Distance:   Left Eye Distance:   Bilateral Distance:    Right Eye Near:   Left Eye Near:    Bilateral Near:     Physical Exam Vitals reviewed.  Constitutional:      General: She is not in acute distress.    Appearance: She is not toxic-appearing.  HENT:     Right Ear: Tympanic membrane and ear canal normal.     Left Ear: Tympanic membrane and ear canal normal.     Nose: Nose normal.     Mouth/Throat:     Mouth: Mucous membranes are moist.     Pharynx: No oropharyngeal exudate or posterior oropharyngeal erythema.  Eyes:     Conjunctiva/sclera: Conjunctivae normal.     Pupils: Pupils are equal, round, and reactive to light.     Comments: There is just 1 beat of nystagmus on lateral gaze bilaterally.  Neck:     Comments: There may be some thyromegaly on exam. Cardiovascular:     Rate and Rhythm: Normal rate and regular rhythm.     Heart sounds: No murmur heard. Pulmonary:     Effort: No respiratory distress.     Breath sounds: No stridor. No wheezing, rhonchi or rales.  Musculoskeletal:     Cervical back: Neck supple.  Lymphadenopathy:     Cervical: No cervical adenopathy.  Skin:    Capillary Refill: Capillary refill takes less than 2 seconds.     Coloration: Skin is not jaundiced or pale.  Neurological:     General: No focal deficit present.     Mental Status:  She is alert and oriented to person, place, and time.     Cranial Nerves: No  cranial nerve deficit.     Motor: No weakness.     Gait: Gait normal.     Deep Tendon Reflexes: Reflexes normal.  Psychiatric:        Behavior: Behavior normal.      UC Treatments / Results  Labs (all labs ordered are listed, but only abnormal results are displayed) Labs Reviewed  CBC  BASIC METABOLIC PANEL WITH GFR  TSH    EKG   Radiology No results found.  Procedures Procedures (including critical care time)  Medications Ordered in UC Medications - No data to display  Initial Impression / Assessment and Plan / UC Course  I have reviewed the triage vital signs and the nursing notes.  Pertinent labs & imaging results that were available during my care of the patient were reviewed by me and considered in my medical decision making (see chart for details).     CBC, BMP and TSH are drawn to assess her symptoms.  Staff will notify her of any significant abnormalities.  Meclizine  is sent in to treat her symptoms.  She is also given information on Epley maneuver.  Staff will help her set up a primary care appointment.  She is to go to the emergency room if symptoms intensify.   Final Clinical Impressions(s) / UC Diagnoses   Final diagnoses:  Vertigo  Nonintractable episodic headache, unspecified headache type  Thyromegaly     Discharge Instructions      Take meclizine  25 mg--1 tablet every 8 hours as needed for vertigo  We have drawn blood to check for platelet counts, electrolytes and sugar, and thyroid  function.  Staff will notify you of the anything is significantly abnormal  You can use the QR code/website at the back of the summary paperwork to schedule yourself a new patient appointment with primary care       ED Prescriptions     Medication Sig Dispense Auth. Provider   meclizine  (ANTIVERT ) 25 MG tablet Take 1 tablet (25 mg total) by mouth 3 (three) times daily as needed for dizziness (vertigo). 30 tablet Hensley Treat K, MD      PDMP  not reviewed this encounter.   Vonna Sharlet POUR, MD 11/21/23 [phone]

## 2023-11-22 ENCOUNTER — Ambulatory Visit (HOSPITAL_COMMUNITY): Payer: Self-pay
